# Patient Record
Sex: Female | Born: 1975 | Race: Black or African American | Marital: Married | Smoking: Never smoker
Health system: Southern US, Community
[De-identification: ages and names within clinical notes are randomized; demographics above are authoritative.]

## PROBLEM LIST (undated history)

## (undated) DIAGNOSIS — F32A Depression, unspecified: Secondary | ICD-10-CM

## (undated) DIAGNOSIS — F329 Major depressive disorder, single episode, unspecified: Secondary | ICD-10-CM

## (undated) DIAGNOSIS — K219 Gastro-esophageal reflux disease without esophagitis: Secondary | ICD-10-CM

## (undated) DIAGNOSIS — F419 Anxiety disorder, unspecified: Secondary | ICD-10-CM

## (undated) DIAGNOSIS — T7840XA Allergy, unspecified, initial encounter: Secondary | ICD-10-CM

## (undated) DIAGNOSIS — Z8619 Personal history of other infectious and parasitic diseases: Secondary | ICD-10-CM

## (undated) HISTORY — DX: Allergy, unspecified, initial encounter: T78.40XA

## (undated) HISTORY — DX: Gastro-esophageal reflux disease without esophagitis: K21.9

## (undated) HISTORY — PX: TUBAL LIGATION: SHX77

## (undated) HISTORY — DX: Major depressive disorder, single episode, unspecified: F32.9

## (undated) HISTORY — DX: Anxiety disorder, unspecified: F41.9

## (undated) HISTORY — PX: APPENDECTOMY: SHX54

## (undated) HISTORY — DX: Depression, unspecified: F32.A

## (undated) HISTORY — DX: Personal history of other infectious and parasitic diseases: Z86.19

---
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
## 2001-10-16 ENCOUNTER — Emergency Department (HOSPITAL_COMMUNITY): Admission: EM | Admit: 2001-10-16 | Discharge: 2001-10-16 | Payer: Self-pay | Admitting: Emergency Medicine

## 2002-04-29 ENCOUNTER — Emergency Department (HOSPITAL_COMMUNITY): Admission: EM | Admit: 2002-04-29 | Discharge: 2002-04-29 | Payer: Self-pay | Admitting: *Deleted

## 2002-05-26 ENCOUNTER — Emergency Department (HOSPITAL_COMMUNITY): Admission: EM | Admit: 2002-05-26 | Discharge: 2002-05-26 | Payer: Self-pay

## 2003-08-05 ENCOUNTER — Emergency Department (HOSPITAL_COMMUNITY): Admission: EM | Admit: 2003-08-05 | Discharge: 2003-08-05 | Payer: Self-pay | Admitting: *Deleted

## 2004-06-13 ENCOUNTER — Emergency Department (HOSPITAL_COMMUNITY): Admission: EM | Admit: 2004-06-13 | Discharge: 2004-06-13 | Payer: Self-pay | Admitting: Family Medicine

## 2004-08-30 ENCOUNTER — Other Ambulatory Visit: Admission: RE | Admit: 2004-08-30 | Discharge: 2004-08-30 | Payer: Self-pay | Admitting: Obstetrics and Gynecology

## 2004-09-22 ENCOUNTER — Encounter: Admission: RE | Admit: 2004-09-22 | Discharge: 2004-09-22 | Payer: Self-pay | Admitting: Internal Medicine

## 2004-10-13 ENCOUNTER — Emergency Department (HOSPITAL_COMMUNITY): Admission: EM | Admit: 2004-10-13 | Discharge: 2004-10-13 | Payer: Self-pay | Admitting: Emergency Medicine

## 2005-12-08 ENCOUNTER — Emergency Department (HOSPITAL_COMMUNITY): Admission: EM | Admit: 2005-12-08 | Discharge: 2005-12-08 | Payer: Self-pay | Admitting: Family Medicine

## 2007-10-28 ENCOUNTER — Inpatient Hospital Stay (HOSPITAL_COMMUNITY): Admission: AD | Admit: 2007-10-28 | Discharge: 2007-10-30 | Payer: Self-pay | Admitting: Obstetrics and Gynecology

## 2008-06-12 ENCOUNTER — Emergency Department (HOSPITAL_COMMUNITY): Admission: EM | Admit: 2008-06-12 | Discharge: 2008-06-12 | Payer: Self-pay | Admitting: Family Medicine

## 2009-07-08 ENCOUNTER — Encounter: Admission: RE | Admit: 2009-07-08 | Discharge: 2009-07-08 | Payer: Self-pay | Admitting: Internal Medicine

## 2009-07-16 LAB — CONVERTED CEMR LAB

## 2009-08-30 ENCOUNTER — Ambulatory Visit: Payer: Self-pay | Admitting: Genetic Counselor

## 2009-11-24 ENCOUNTER — Ambulatory Visit: Payer: Self-pay | Admitting: Genetic Counselor

## 2010-05-14 ENCOUNTER — Emergency Department (HOSPITAL_COMMUNITY): Admission: EM | Admit: 2010-05-14 | Discharge: 2010-05-14 | Payer: Self-pay | Admitting: Family Medicine

## 2010-06-15 ENCOUNTER — Ambulatory Visit: Payer: Self-pay | Admitting: Internal Medicine

## 2010-06-15 DIAGNOSIS — Z9189 Other specified personal risk factors, not elsewhere classified: Secondary | ICD-10-CM | POA: Insufficient documentation

## 2010-06-15 DIAGNOSIS — Z87448 Personal history of other diseases of urinary system: Secondary | ICD-10-CM | POA: Insufficient documentation

## 2010-06-15 LAB — CONVERTED CEMR LAB
ALT: 30 units/L (ref 0–35)
AST: 27 units/L (ref 0–37)
Albumin: 4.1 g/dL (ref 3.5–5.2)
Alkaline Phosphatase: 57 units/L (ref 39–117)
BUN: 14 mg/dL (ref 6–23)
Basophils Absolute: 0 10*3/uL (ref 0.0–0.1)
Basophils Relative: 0.3 % (ref 0.0–3.0)
Bilirubin Urine: NEGATIVE
Bilirubin, Direct: 0.1 mg/dL (ref 0.0–0.3)
CO2: 26 meq/L (ref 19–32)
Calcium: 9.1 mg/dL (ref 8.4–10.5)
Chloride: 108 meq/L (ref 96–112)
Cholesterol: 121 mg/dL (ref 0–200)
Creatinine, Ser: 0.9 mg/dL (ref 0.4–1.2)
Eosinophils Absolute: 0.1 10*3/uL (ref 0.0–0.7)
Eosinophils Relative: 1.3 % (ref 0.0–5.0)
GFR calc non Af Amer: 92.87 mL/min (ref >60.00)
Glucose, Bld: 85 mg/dL (ref 70–99)
HCT: 42.4 % (ref 36.0–46.0)
HDL: 42.3 mg/dL (ref >39.00)
Hemoglobin: 14.5 g/dL (ref 12.0–15.0)
Ketones, ur: NEGATIVE mg/dL
LDL Cholesterol: 66 mg/dL (ref 0–99)
Lymphocytes Relative: 32.7 % (ref 12.0–46.0)
Lymphs Abs: 3.1 10*3/uL (ref 0.7–4.0)
MCHC: 34.1 g/dL (ref 30.0–36.0)
MCV: 88.7 fL (ref 78.0–100.0)
Monocytes Absolute: 0.7 10*3/uL (ref 0.1–1.0)
Monocytes Relative: 7.5 % (ref 3.0–12.0)
Neutro Abs: 5.6 10*3/uL (ref 1.4–7.7)
Neutrophils Relative %: 58.2 % (ref 43.0–77.0)
Nitrite: NEGATIVE
Platelets: 290 10*3/uL (ref 150.0–400.0)
Potassium: 4.7 meq/L (ref 3.5–5.1)
RBC: 4.78 M/uL (ref 3.87–5.11)
RDW: 12.8 % (ref 11.5–14.6)
Sodium: 141 meq/L (ref 135–145)
Specific Gravity, Urine: 1.025 (ref 1.000–1.030)
TSH: 0.81 microintl units/mL (ref 0.35–5.50)
Total Bilirubin: 1 mg/dL (ref 0.3–1.2)
Total CHOL/HDL Ratio: 3
Total Protein, Urine: NEGATIVE mg/dL
Total Protein: 7.2 g/dL (ref 6.0–8.3)
Triglycerides: 65 mg/dL (ref 0.0–149.0)
Urine Glucose: NEGATIVE mg/dL
Urobilinogen, UA: 0.2 (ref 0.0–1.0)
VLDL: 13 mg/dL (ref 0.0–40.0)
WBC: 9.6 10*3/uL (ref 4.5–10.5)
pH: 6 (ref 5.0–8.0)

## 2010-07-28 NOTE — Assessment & Plan Note (Signed)
Summary: New/Bcbs/#/cd   Vital Signs:  Patient profile:   35 year old female Height:      64 inches (162.56 cm) Weight:      170.8 pounds (77.64 kg) BMI:     29.42 O2 Sat:      98 % on Room air Temp:     98.0 degrees F (36.67 degrees C) oral Pulse rate:   76 / minute BP sitting:   90 / 68  (left arm) Cuff size:   regular  Vitals Entered By: Orlan Leavens RMA (June 15, 2010 8:19 AM)  O2 Flow:  Room air CC: New patient Is Patient Diabetic? No Pain Assessment Patient in pain? no        Primary Care Denzell Colasanti:  Newt Lukes MD  CC:  New patient.  History of Present Illness: new pt to me and our practice, here to est care  patient is here today for annual physical. Patient feels well and has no complaints.   Preventive Screening-Counseling & Management  Alcohol-Tobacco     Alcohol drinks/day: <1     Alcohol Counseling: not indicated; use of alcohol is not excessive or problematic     Smoking Status: quit     Year Started: 1989     Year Quit: 2001     Tobacco Counseling: to remain off tobacco products  Caffeine-Diet-Exercise     Does Patient Exercise: yes     Exercise Counseling: not indicated; exercise is adequate  Safety-Violence-Falls     Seat Belt Counseling: not indicated; patient wears seat belts     Helmet Counseling: not indicated; patient wears helmet when riding bicycle/motocycle     Firearms in the Home: no firearms in the home     Firearm Counseling: not applicable     Violence in the Home: no risk noted     Sexual Abuse: no     Fall Risk Counseling: not indicated; no significant falls noted  Clinical Review Panels:  Prevention   Last Mammogram:  No specific mammographic evidence of malignancy.   (08/17/2009)   Last Pap Smear:  Interpretation Result:Negative for intraepithelial Lesion or Malignancy.    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 I have reviewed all other systems and they were negative.   Physical Exam  General:  alert, well-developed, well-nourished, and cooperative to examination.    Head:  Normocephalic and atraumatic without obvious abnormalities. No apparent alopecia or balding. Eyes:  vision grossly intact; pupils equal, round and reactive to light.  conjunctiva and lids normal.    Ears:  normal pinnae bilaterally, without erythema, swelling, or tenderness to palpation. TMs clear, without effusion, or cerumen impaction. Hearing grossly normal bilaterally  Mouth:  teeth and gums in good repair; mucous membranes moist, without lesions or ulcers. oropharynx clear without exudate,  no erythema.  Neck:  supple, full ROM, no masses, no thyromegaly; no thyroid nodules or tenderness. no JVD or carotid bruits.   Lungs:  normal respiratory effort, no intercostal retractions or use of accessory muscles; normal breath sounds bilaterally - no crackles and no wheezes.    Heart:  normal rate, regular rhythm, no murmur, and no rub. BLE without edema. normal DP pulses and normal cap refill in all 4 extremities    Abdomen:  soft, non-tender, normal bowel sounds, no distention; no masses and no appreciable hepatomegaly or splenomegaly.   Genitalia:  defer gyn Msk:  No deformity or scoliosis noted of thoracic or lumbar  spine.   Neurologic:  alert & oriented X3 and cranial nerves II-XII symetrically intact.  strength normal in all extremities, sensation intact to light touch, and gait normal. speech fluent without dysarthria or aphasia; follows commands with good comprehension.  Skin:  no rashes, vesicles, ulcers, or erythema. No nodules or irregularity to palpation.  Psych:  Oriented X3, memory intact for recent and remote, normally interactive, good eye contact, not anxious appearing, not depressed appearing, and not agitated.      Impression & Recommendations:  Problem # 1:  PREVENTIVE HEALTH CARE (ICD-V70.0) Patient has been counseled on age-appropriate routine health concerns for screening and prevention. These are reviewed and up-to-date. Immunizations are up-to-date or declined. Labs ordered and will be reviewed.  Orders: TLB-Lipid Panel (80061-LIPID) TLB-BMP (Basic Metabolic Panel-BMET) (80048-METABOL) TLB-CBC Platelet - w/Differential (85025-CBCD) TLB-Hepatic/Liver Function Pnl (80076-HEPATIC) TLB-TSH (Thyroid Stimulating Hormone) (84443-TSH) TLB-Udip w/ Micro (81001-URINE)  Patient Instructions: 1)  it was good to see you today. 2)  test(s) ordered today - your results will be posted on the phone tree for review in 48-72 hours from the time of test completion; call [phone] and enter your 9 digit MRN (listed above on this page, just below your name); if any changes need to be made or there are abnormal results, you will be contacted directly. 3)  stay active and eat healthy as we discussed - this is the best way to control your risk of developing diabetes 4)  Please schedule a follow-up appointment annually for medical physical and labs, call sooner if problems.    Orders Added: 1)  TLB-Lipid Panel [80061-LIPID] 2)  TLB-BMP (Basic Metabolic Panel-BMET) [80048-METABOL] 3)  TLB-CBC Platelet - w/Differential [85025-CBCD] 4)  TLB-Hepatic/Liver Function Pnl [80076-HEPATIC] 5)  TLB-TSH (Thyroid  Stimulating Hormone) [84443-TSH] 6)  TLB-Udip w/ Micro [81001-URINE] 7)  New Patient 18-39 years [99385]   Immunization History:  Tetanus/Td Immunization History:    Tetanus/Td:  historical (06/27/2003)   Immunization History:  Tetanus/Td Immunization History:    Tetanus/Td:  Historical (06/27/2003)    Pap Smear  Procedure date:  07/16/2009  Findings:      Interpretation Result:Negative for intraepithelial Lesion or Malignancy.     Mammogram  Procedure date:  08/17/2009  Findings:      No specific mammographic evidence of malignancy.

## 2010-09-06 LAB — POCT URINALYSIS DIPSTICK
Bilirubin Urine: NEGATIVE
Glucose, UA: NEGATIVE mg/dL
Ketones, ur: NEGATIVE mg/dL
Nitrite: NEGATIVE
Protein, ur: NEGATIVE mg/dL
Specific Gravity, Urine: 1.02 (ref 1.005–1.030)
Urobilinogen, UA: 0.2 mg/dL (ref 0.0–1.0)
pH: 5 (ref 5.0–8.0)

## 2010-09-06 LAB — POCT PREGNANCY, URINE: Preg Test, Ur: NEGATIVE

## 2011-03-30 LAB — WET PREP, GENITAL: Yeast Wet Prep HPF POC: NONE SEEN

## 2011-03-30 LAB — GC/CHLAMYDIA PROBE AMP, GENITAL
Chlamydia, DNA Probe: NEGATIVE
GC Probe Amp, Genital: NEGATIVE

## 2011-03-30 LAB — POCT PREGNANCY, URINE: Preg Test, Ur: NEGATIVE

## 2013-10-18 DIAGNOSIS — F988 Other specified behavioral and emotional disorders with onset usually occurring in childhood and adolescence: Secondary | ICD-10-CM | POA: Insufficient documentation

## 2015-07-14 LAB — CBC AND DIFFERENTIAL
HCT: 43 (ref 36–46)
Hemoglobin: 13.9 (ref 12.0–16.0)
Platelets: 283 (ref 150–399)
WBC: 9.9

## 2016-05-19 ENCOUNTER — Encounter: Payer: Self-pay | Admitting: Family Medicine

## 2017-01-10 ENCOUNTER — Encounter: Payer: Self-pay | Admitting: Family Medicine

## 2017-05-05 ENCOUNTER — Emergency Department (HOSPITAL_COMMUNITY): Payer: Self-pay

## 2017-05-05 ENCOUNTER — Encounter (HOSPITAL_COMMUNITY): Payer: Self-pay | Admitting: Emergency Medicine

## 2017-05-05 ENCOUNTER — Emergency Department (HOSPITAL_COMMUNITY)
Admission: EM | Admit: 2017-05-05 | Discharge: 2017-05-05 | Disposition: A | Discharge status: Home / Self Care | Payer: Self-pay | Attending: Emergency Medicine | Admitting: Emergency Medicine

## 2017-05-05 DIAGNOSIS — R6889 Other general symptoms and signs: Secondary | ICD-10-CM | POA: Insufficient documentation

## 2017-05-05 DIAGNOSIS — R112 Nausea with vomiting, unspecified: Secondary | ICD-10-CM | POA: Insufficient documentation

## 2017-05-05 LAB — CBC WITH DIFFERENTIAL/PLATELET
Basophils Absolute: 0 10*3/uL (ref 0.0–0.1)
Basophils Relative: 0 %
Eosinophils Absolute: 0.1 10*3/uL (ref 0.0–0.7)
Eosinophils Relative: 1 %
HCT: 40.9 % (ref 36.0–46.0)
Hemoglobin: 13.5 g/dL (ref 12.0–15.0)
Lymphocytes Relative: 35 %
Lymphs Abs: 3.4 10*3/uL (ref 0.7–4.0)
MCH: 29.1 pg (ref 26.0–34.0)
MCHC: 33 g/dL (ref 30.0–36.0)
MCV: 88.1 fL (ref 78.0–100.0)
Monocytes Absolute: 0.5 10*3/uL (ref 0.1–1.0)
Monocytes Relative: 6 %
Neutro Abs: 5.5 10*3/uL (ref 1.7–7.7)
Neutrophils Relative %: 58 %
Platelets: 296 10*3/uL (ref 150–400)
RBC: 4.64 MIL/uL (ref 3.87–5.11)
RDW: 12.7 % (ref 11.5–15.5)
WBC: 9.6 10*3/uL (ref 4.0–10.5)

## 2017-05-05 LAB — INFLUENZA PANEL BY PCR (TYPE A & B)
Influenza A By PCR: NEGATIVE
Influenza B By PCR: NEGATIVE

## 2017-05-05 LAB — URINALYSIS, ROUTINE W REFLEX MICROSCOPIC
Bilirubin Urine: NEGATIVE
Glucose, UA: NEGATIVE mg/dL
Hgb urine dipstick: NEGATIVE
Ketones, ur: NEGATIVE mg/dL
Leukocytes, UA: NEGATIVE
Nitrite: NEGATIVE
Protein, ur: NEGATIVE mg/dL
Specific Gravity, Urine: 1.021 (ref 1.005–1.030)
pH: 8 (ref 5.0–8.0)

## 2017-05-05 LAB — COMPREHENSIVE METABOLIC PANEL
ALT: 13 U/L — ABNORMAL LOW (ref 14–54)
AST: 21 U/L (ref 15–41)
Albumin: 3.9 g/dL (ref 3.5–5.0)
Alkaline Phosphatase: 61 U/L (ref 38–126)
Anion gap: 8 (ref 5–15)
BUN: 13 mg/dL (ref 6–20)
CO2: 22 mmol/L (ref 22–32)
Calcium: 8.7 mg/dL — ABNORMAL LOW (ref 8.9–10.3)
Chloride: 104 mmol/L (ref 101–111)
Creatinine, Ser: 0.94 mg/dL (ref 0.44–1.00)
GFR calc Af Amer: >60 mL/min (ref >60)
GFR calc non Af Amer: >60 mL/min (ref >60)
Glucose, Bld: 91 mg/dL (ref 65–99)
Potassium: 4.1 mmol/L (ref 3.5–5.1)
Sodium: 134 mmol/L — ABNORMAL LOW (ref 135–145)
Total Bilirubin: 0.9 mg/dL (ref 0.3–1.2)
Total Protein: 7 g/dL (ref 6.5–8.1)

## 2017-05-05 LAB — I-STAT CG4 LACTIC ACID, ED: Lactic Acid, Venous: 0.72 mmol/L (ref 0.5–1.9)

## 2017-05-05 LAB — I-STAT BETA HCG BLOOD, ED (MC, WL, AP ONLY): I-stat hCG, quantitative: <5 m[IU]/mL (ref <5)

## 2017-05-05 LAB — LIPASE, BLOOD: Lipase: 18 U/L (ref 11–51)

## 2017-05-05 MED ORDER — ONDANSETRON HCL 4 MG/2ML IJ SOLN
4.0000 mg | Freq: Once | INTRAMUSCULAR | Status: AC
  Administered 2017-05-05: 4 mg via INTRAVENOUS
  Filled 2017-05-05: qty 2

## 2017-05-05 MED ORDER — PROCHLORPERAZINE EDISYLATE 5 MG/ML IJ SOLN
5.0000 mg | Freq: Once | INTRAMUSCULAR | Status: AC
  Administered 2017-05-05: 5 mg via INTRAVENOUS
  Filled 2017-05-05: qty 2

## 2017-05-05 MED ORDER — PROMETHAZINE HCL 25 MG PO TABS: 12.5000 mg | ORAL_TABLET | Freq: Four times a day (QID) | ORAL | 0 refills | Status: DC | PRN

## 2017-05-05 MED ORDER — SODIUM CHLORIDE 0.9 % IV BOLUS (SEPSIS)
1000.0000 mL | Freq: Once | INTRAVENOUS | Status: AC
  Administered 2017-05-05: 1000 mL via INTRAVENOUS

## 2017-05-05 MED ORDER — DIPHENHYDRAMINE HCL 50 MG/ML IJ SOLN
12.5000 mg | Freq: Once | INTRAMUSCULAR | Status: AC
  Administered 2017-05-05: 12.5 mg via INTRAVENOUS
  Filled 2017-05-05: qty 1

## 2017-05-05 MED ORDER — ACETAMINOPHEN 500 MG PO TABS
1000.0000 mg | ORAL_TABLET | Freq: Once | ORAL | Status: AC
  Administered 2017-05-05: 1000 mg via ORAL
  Filled 2017-05-05: qty 2

## 2017-05-05 NOTE — ED Notes (Signed)
Patient transported to X-ray 

## 2017-05-05 NOTE — Discharge Instructions (Signed)
You appear to have a viral infection.  Your flu swab is currently still pending and you may check this through my chart. Please return to the emergency department if you are unable to hold down foods or fluids or have any new or concerning symptoms, worsening or localizing abdominal pain.

## 2017-05-05 NOTE — ED Notes (Signed)
Pt verbalized understanding of discharge instructions and denies any further questions at this time.   

## 2017-05-05 NOTE — ED Triage Notes (Signed)
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

## 2017-05-05 NOTE — ED Provider Notes (Signed)
[facility]   CSN: [id number] Arrival date & time: 05/05/17  0815     History   Chief Complaint Chief Complaint  Patient presents with  . Emesis  . Nausea  . Generalized Body Aches    HPI Paige Wilkinson is a 41 y.o. female.  Who presents the emergency department chief complaint of nausea and vomiting.  States that yesterday she had prodromal symptoms of fatigue, myalgias, body aches.  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  After work she stopped and got a coffee drink and then began having vomiting.  She states that she has had multiple episodes of nonbloody nonbilious vomitus and has been unable to hold down any fluids since last night.  She complains of mild headache, nausea and body aches.  She denies any focal abdominal tenderness.  She denies flank pain or urinary symptoms.  She denies any ingestion of suspicious foods, recent foreign travel or sick contacts with similar symptoms.  She does have a painful cough that has developed states that it feels that it is in her bronchi.  She denies any chest pain, shortness of breath.  The patient did not take any medications prior to arrival.  She has no history of similar or recurrent symptoms.  HPI  History reviewed. No pertinent past medical history.  Patient Active Problem List   Diagnosis Date Noted  . UTI'S, HX OF 06/15/2010  . CHICKENPOX, HX OF 06/15/2010    Past Surgical History:  Procedure Laterality Date  . APPENDECTOMY    . TUBAL LIGATION      OB History    No data available       Home Medications    Prior to Admission medications   Medication Sig Start Date End Date Taking? Authorizing Provider  promethazine (PHENERGAN) 25 MG tablet Take 0.5-1 tablets (12.5-25 mg total) every 6 (six) hours as needed by mouth for nausea or vomiting. 05/05/17   Arthor CaptainHarris, Ayan Yankey, PA-C    Family History No family history on  file.  Social History Social History   Tobacco Use  . Smoking status: Never Smoker  . Smokeless tobacco: Never Used  Substance Use Topics  . Alcohol use: No    Frequency: Never  . Drug use: No     Allergies   Patient has no allergy information on record.   Review of Systems Review of Systems  Ten systems reviewed and are negative for acute change, except as noted in the HPI.   Physical Exam Updated Vital Signs BP 119/76 (BP Location: Right Arm)   Pulse 82   Temp 98.3 F (36.8 C) (Oral)   Resp 20   LMP 04/26/2017   SpO2 96%   Physical Exam  Constitutional: She is oriented to person, place, and time. She appears well-developed and well-nourished. No distress.  HENT:  Head: Normocephalic and atraumatic.  Eyes: Conjunctivae and EOM are normal. Pupils are equal, round, and reactive to light. No scleral icterus.  Glassy eyed, Nasal erythema  Neck: Normal range of motion.  Cardiovascular: Normal rate, regular rhythm, normal heart sounds and intact distal pulses. Exam reveals no gallop and no friction rub.  No murmur heard. Pulmonary/Chest: Effort normal and breath sounds normal. No respiratory distress.  Abdominal: Soft. Bowel sounds are normal. She exhibits no distension and no mass. There is no tenderness. There is no guarding.  Neurological: She is alert and oriented to person, place, and time.  Skin: Skin is warm and dry. She is not diaphoretic.  Psychiatric: Her behavior is normal.  Nursing note and vitals reviewed.    ED Treatments / Results  Labs (all labs ordered are listed, but only abnormal results are displayed) Labs Reviewed  COMPREHENSIVE METABOLIC PANEL - Abnormal; Notable for the following components:      Result Value   Sodium 134 (*)    Calcium 8.7 (*)    ALT 13 (*)    All other components within normal limits  CBC WITH DIFFERENTIAL/PLATELET  URINALYSIS, ROUTINE W REFLEX MICROSCOPIC  LIPASE, BLOOD  INFLUENZA PANEL BY PCR (TYPE A & B)   I-STAT CG4 LACTIC ACID, ED  I-STAT BETA HCG BLOOD, ED ([facility], [facility], [facility] ONLY)  I-STAT BETA HCG BLOOD, ED ([facility], [facility], [facility] ONLY)    EKG  EKG Interpretation None       Radiology Dg Chest 2 View  Result Date: 05/05/2017 CLINICAL DATA:  Chills with nausea and vomiting. EXAM: CHEST  2 VIEW COMPARISON:  None. FINDINGS: Lungs are clear. Heart size and pulmonary vascularity are normal. No adenopathy. No pneumothorax. No bone lesions. IMPRESSION: No edema or consolidation. Electronically Signed   By: Bretta BangWilliam  Woodruff III M.D.   On: 05/05/2017 08:52    Procedures Procedures (including critical care time)  Medications Ordered in ED Medications  ondansetron (ZOFRAN) injection 4 mg (4 mg Intravenous Given 05/05/17 0908)  sodium chloride 0.9 % bolus 1,000 mL (0 mLs Intravenous Stopped 05/05/17 1034)  acetaminophen (TYLENOL) tablet 1,000 mg (1,000 mg Oral Given 05/05/17 1016)  prochlorperazine (COMPAZINE) injection 5 mg (5 mg Intravenous Given 05/05/17 1041)  diphenhydrAMINE (BENADRYL) injection 12.5 mg (12.5 mg Intravenous Given 05/05/17 1041)     Initial Impression / Assessment and Plan / ED Course  I have reviewed the triage vital signs and the nursing notes.  Pertinent labs & imaging results that were available during my care of the patient were reviewed by me and considered in my medical decision making (see chart for details).  Clinical Course as of May 05 1099  Sat May 05, 2017  1047 Patient n/v/ and flu like sxs.. Most likely viral.  Flu is pending. She feels improved. Vitals are stable, no fever.  No signs of dehydration, tolerating PO fluids > 6 oz.  Lungs are clear.  No focal abdominal pain, no concern for appendicitis, cholecystitis, pancreatitis, ruptured viscus, UTI, kidney stone, or any other abdominal etiology.  Supportive therapy indicated with return if symptoms worsen.  Patient counseled on return precautions.   [AH]    Clinical Course User Index [AH] Arthor CaptainHarris, Harini Dearmond, PA-C       Final Clinical Impressions(s) / ED Diagnoses   Final diagnoses:  Flu-like symptoms  Nausea and vomiting, intractability of vomiting not specified, unspecified vomiting type    ED Discharge Orders        Ordered    promethazine (PHENERGAN) 25 MG tablet  Every 6 hours PRN     05/05/17 1053       Arthor CaptainHarris, Naleigha Raimondi, PA-C 05/05/17 1100    Alvira MondaySchlossman, Erin, MD 05/06/17 2147

## 2017-05-05 NOTE — ED Notes (Signed)
ED Provider at bedside. 

## 2017-06-20 DIAGNOSIS — L089 Local infection of the skin and subcutaneous tissue, unspecified: Secondary | ICD-10-CM | POA: Diagnosis not present

## 2017-06-20 DIAGNOSIS — M791 Myalgia, unspecified site: Secondary | ICD-10-CM | POA: Diagnosis not present

## 2017-06-20 DIAGNOSIS — S90561A Insect bite (nonvenomous), right ankle, initial encounter: Secondary | ICD-10-CM | POA: Diagnosis not present

## 2017-06-20 DIAGNOSIS — S30860A Insect bite (nonvenomous) of lower back and pelvis, initial encounter: Secondary | ICD-10-CM | POA: Diagnosis not present

## 2017-07-13 ENCOUNTER — Encounter: Payer: Self-pay | Admitting: Family Medicine

## 2017-07-13 ENCOUNTER — Ambulatory Visit: Payer: BLUE CROSS/BLUE SHIELD | Admitting: Family Medicine

## 2017-07-13 VITALS — BP 94/78 | HR 74 | Temp 98.1°F | Ht 64.5 in | Wt 187.3 lb

## 2017-07-13 DIAGNOSIS — Z7689 Persons encountering health services in other specified circumstances: Secondary | ICD-10-CM | POA: Diagnosis not present

## 2017-07-13 DIAGNOSIS — F988 Other specified behavioral and emotional disorders with onset usually occurring in childhood and adolescence: Secondary | ICD-10-CM | POA: Diagnosis not present

## 2017-07-13 NOTE — Progress Notes (Signed)
Patient presents to clinic today to establish care.  SUBJECTIVE: PMH:Pt is a 42 yo with pmh sig for ADD, and depression.  She was formerly seen in [place], [place]. Last Physical was 01/08/2017.  Pt is a G6P3.  LMP 06/26/17  ADD: -Pt states she underwent testing in [place]. -Pt endorses difficulty focusing x yrs -she noticed the difficulty focusing more in 2012 when she was in school.   -Pt has been taking adderall ER 30 mg.  She has been getting the rx from Dr. Sharlett Iles, a provider she works with at [facility].  H/o Depression: -pt states she had an episode of depression yrs ago, but does not "claim that now". -Pt states she went through a rough yr.  She was homeless at one point. -Pt states she then moved to [facility], and has been doing well ever since. -pt was never on meds.  Allergies: NKDA  P Surgical Hx: Bunionectomy Tubal ligation-essur 2013 Appendectomy 07/18/1999  Social history: Patient is married.  She has 3 children.  Patient has a daughter age 22 and 2 sons ages 40 and 25.  Patient is also a grandmother, with a 42-year-old and one on the way.  Pt works as a Technical brewer at [employer].  Patient denies tobacco and drug use.  She formerly smoked cigarettes in her late teens.  Patient endorses rare ethanol use stating she may have a glass of wine every now and then.  Family medical history: Mom-deceased, breast cancer at age 8, HIV via blood transfusion.  Died at age 87 Dad-deceased cancer unsure what type Brother-Richard, alive MGM-alive, arthritis, DM, hearing loss PGM-deceased, asthma, DM, HLD, HTN, kidney disease, obesity  Health Maintenance: Immunizations -- Tetanus 07/2012, influenza 03/26/17, TB skin test 07/2012 Mammogram --01/09/2017 PAP -- 01/08/17    Past Medical History:  Diagnosis Date  . Depression   . History of chicken pox     Past Surgical History:  Procedure Laterality Date  . APPENDECTOMY    . TUBAL LIGATION      Current Outpatient Medications on  File Prior to Visit  Medication Sig Dispense Refill  . amphetamine-dextroamphetamine (ADDERALL) 20 MG tablet Take 20 mg by mouth daily.    . Ascorbic Acid (VITAMIN C) 1000 MG tablet Take 1,000 mg by mouth daily.    Marland Kitchen BIOTIN PO Take by mouth.    . cyanocobalamin 500 MCG tablet Take 500 mcg by mouth daily.     No current facility-administered medications on file prior to visit.     No Known Allergies  History reviewed. No pertinent family history.  Social History   Socioeconomic History  . Marital status: Married    Spouse name: Not on file  . Number of children: Not on file  . Years of education: Not on file  . Highest education level: Not on file  Social Needs  . Financial resource strain: Not on file  . Food insecurity - worry: Not on file  . Food insecurity - inability: Not on file  . Transportation needs - medical: Not on file  . Transportation needs - non-medical: Not on file  Occupational History  . Not on file  Tobacco Use  . Smoking status: Former Research scientist (life sciences)  . Smokeless tobacco: Never Used  Substance and Sexual Activity  . Alcohol use: Yes    Frequency: Never    Comment: rarely  . Drug use: No  . Sexual activity: Not on file  Other Topics Concern  . Not on file  Social  History Narrative  . Not on file    ROS General: Denies fever, chills, night sweats, changes in weight, changes in appetite HEENT: Denies headaches, ear pain, changes in vision, rhinorrhea, sore throat CV: Denies CP, palpitations, SOB, orthopnea Pulm: Denies SOB, cough, wheezing GI: Denies abdominal pain, nausea, vomiting, diarrhea, constipation GU: Denies dysuria, hematuria, frequency, vaginal discharge Msk: Denies muscle cramps, joint pains Neuro: Denies weakness, numbness, tingling Skin: Denies rashes, bruising Psych: Denies depression, anxiety, hallucinations  BP 94/78 (BP Location: Right Arm, Patient Position: Sitting, Cuff Size: Large)   Pulse 74   Temp 98.1 F (36.7 C) (Oral)   Ht  5' 4.5" (1.638 m)   Wt 187 lb 4.8 oz (85 kg)   BMI 31.65 kg/m   Physical Exam Gen. Pleasant, well developed, well-nourished, in NAD HEENT - [place]/AT, PERRL, no scleral icterus, no nasal drainage, pharynx without erythema or exudate. Lungs: no use of accessory muscles, CTAB, no wheezes, rales or rhonchi Cardiovascular: RRR, No r/g/m, no peripheral edema Abdomen: BS present, soft, nontender,nondistended Neuro:  A&Ox3, CN II-XII intact, normal gait Skin:  Warm, dry, intact, no lesions Psych: normal affect, mood appropriate   Recent Results (from the past 2160 hour(s))  Comprehensive metabolic panel     Status: Abnormal   Collection Time: 05/05/17  8:28 AM  Result Value Ref Range   Sodium 134 (L) 135 - 145 mmol/L   Potassium 4.1 3.5 - 5.1 mmol/L   Chloride 104 101 - 111 mmol/L   CO2 22 22 - 32 mmol/L   Glucose, Bld 91 65 - 99 mg/dL   BUN 13 6 - 20 mg/dL   Creatinine, Ser 0.94 0.44 - 1.00 mg/dL   Calcium 8.7 (L) 8.9 - 10.3 mg/dL   Total Protein 7.0 6.5 - 8.1 g/dL   Albumin 3.9 3.5 - 5.0 g/dL   AST 21 15 - 41 U/L   ALT 13 (L) 14 - 54 U/L   Alkaline Phosphatase 61 38 - 126 U/L   Total Bilirubin 0.9 0.3 - 1.2 mg/dL   GFR calc non Af Amer >60 >60 mL/min   GFR calc Af Amer >60 >60 mL/min    Comment: (NOTE) The eGFR has been calculated using the CKD EPI equation. This calculation has not been validated in all clinical situations. eGFR's persistently <60 mL/min signify possible Chronic Kidney Disease.    Anion gap 8 5 - 15  CBC with Differential     Status: None   Collection Time: 05/05/17  8:28 AM  Result Value Ref Range   WBC 9.6 4.0 - 10.5 K/uL   RBC 4.64 3.87 - 5.11 MIL/uL   Hemoglobin 13.5 12.0 - 15.0 g/dL   HCT 40.9 36.0 - 46.0 %   MCV 88.1 78.0 - 100.0 fL   MCH 29.1 26.0 - 34.0 pg   MCHC 33.0 30.0 - 36.0 g/dL   RDW 12.7 11.5 - 15.5 %   Platelets 296 150 - 400 K/uL   Neutrophils Relative % 58 %   Neutro Abs 5.5 1.7 - 7.7 K/uL   Lymphocytes Relative 35 %   Lymphs Abs  3.4 0.7 - 4.0 K/uL   Monocytes Relative 6 %   Monocytes Absolute 0.5 0.1 - 1.0 K/uL   Eosinophils Relative 1 %   Eosinophils Absolute 0.1 0.0 - 0.7 K/uL   Basophils Relative 0 %   Basophils Absolute 0.0 0.0 - 0.1 K/uL  Lipase, blood     Status: None   Collection Time: 05/05/17  8:28 AM  Result Value Ref Range   Lipase 18 11 - 51 U/L  Urinalysis, Routine w reflex microscopic     Status: None   Collection Time: 05/05/17  8:35 AM  Result Value Ref Range   Color, Urine YELLOW YELLOW   APPearance CLEAR CLEAR   Specific Gravity, Urine 1.021 1.005 - 1.030   pH 8.0 5.0 - 8.0   Glucose, UA NEGATIVE NEGATIVE mg/dL   Hgb urine dipstick NEGATIVE NEGATIVE   Bilirubin Urine NEGATIVE NEGATIVE   Ketones, ur NEGATIVE NEGATIVE mg/dL   Protein, ur NEGATIVE NEGATIVE mg/dL   Nitrite NEGATIVE NEGATIVE   Leukocytes, UA NEGATIVE NEGATIVE  I-Stat beta hCG blood, ED     Status: None   Collection Time: 05/05/17  8:41 AM  Result Value Ref Range   I-stat hCG, quantitative <5.0 <5 mIU/mL   Comment 3            Comment:   GEST. AGE      CONC.  (mIU/mL)   <=1 WEEK        5 - 50     2 WEEKS       50 - 500     3 WEEKS       100 - 10,000     4 WEEKS     1,000 - 30,000        FEMALE AND NON-PREGNANT FEMALE:     LESS THAN 5 mIU/mL   I-Stat CG4 Lactic Acid, ED     Status: None   Collection Time: 05/05/17  8:43 AM  Result Value Ref Range   Lactic Acid, Venous 0.72 0.5 - 1.9 mmol/L  Influenza panel by PCR (type A & B)     Status: None   Collection Time: 05/05/17  8:54 AM  Result Value Ref Range   Influenza A By PCR NEGATIVE NEGATIVE   Influenza B By PCR NEGATIVE NEGATIVE    Comment: (NOTE) The Xpert Xpress Flu assay is intended as an aid in the diagnosis of  influenza and should not be used as a sole basis for treatment.  This  assay is FDA approved for nasopharyngeal swab specimens only. Nasal  washings and aspirates are unacceptable for Xpert Xpress Flu testing.     Assessment/Plan: Attention  deficit disorder, unspecified hyperactivity presence -inattentive type -Continue Adderall ER 30 mg -Database reviewed. -Pt to call clinic when needs refill in the next few wks..  Encounter to establish care -We reviewed the PMH, PSH, FH, SH, Meds and Allergies. -We provided refills for any medications we will prescribe as needed. -We addressed current concerns per orders and patient instructions. -We have asked for records for pertinent exams, studies, vaccines and notes from previous providers. -We have advised patient to follow up per instructions below.    F/u prn.  Discussed scheduling a CPE in the next 4-6 months as pt had labs done in July.   Grier Mitts, MD

## 2017-07-24 ENCOUNTER — Encounter: Payer: Self-pay | Admitting: Family Medicine

## 2017-07-26 ENCOUNTER — Telehealth: Payer: Self-pay | Admitting: Family Medicine

## 2017-07-26 NOTE — Telephone Encounter (Signed)
Please advise on patient Adderall refill. Thank you.

## 2017-07-26 NOTE — Telephone Encounter (Signed)
Pt requesting refill for ADDERALL. No provider noted for this prescription and it is a controlled med.  LOV 07/13/17 Pharmacy  [facility] [address]

## 2017-07-26 NOTE — Telephone Encounter (Unsigned)
Copied from CRM [phone]#[id number]. Topic: Quick Communication - See Telephone Encounter >> Jul 25, 2017  3:58 PM Waymon AmatoBurton, Donna F wrote: CRM for notification. See Telephone encounter for: pt is need to get a refill on adderall [facility] [address] [phone]  07/25/17.

## 2017-07-27 ENCOUNTER — Other Ambulatory Visit: Payer: Self-pay | Admitting: Family Medicine

## 2017-07-31 ENCOUNTER — Other Ambulatory Visit: Payer: Self-pay | Admitting: Family Medicine

## 2017-07-31 MED ORDER — AMPHETAMINE-DEXTROAMPHET ER 30 MG PO CP24: 30.0000 mg | ORAL_CAPSULE | ORAL | 0 refills | Status: DC

## 2017-07-31 NOTE — Progress Notes (Signed)
rx for adderall er 30 mg, 3 month prescriptions printed out.

## 2017-07-31 NOTE — Telephone Encounter (Signed)
rx's printed out

## 2017-08-01 NOTE — Telephone Encounter (Signed)
Left a VM for patient making her aware that prescriptions are ready for pickup and will be waiting at the front desk.

## 2017-08-06 ENCOUNTER — Encounter: Payer: Self-pay | Admitting: Family Medicine

## 2017-08-06 ENCOUNTER — Ambulatory Visit (INDEPENDENT_AMBULATORY_CARE_PROVIDER_SITE_OTHER): Payer: BLUE CROSS/BLUE SHIELD | Admitting: Family Medicine

## 2017-08-06 VITALS — BP 100/62 | HR 85 | Temp 98.8°F | Wt 188.3 lb

## 2017-08-06 DIAGNOSIS — B9789 Other viral agents as the cause of diseases classified elsewhere: Secondary | ICD-10-CM | POA: Diagnosis not present

## 2017-08-06 DIAGNOSIS — J069 Acute upper respiratory infection, unspecified: Secondary | ICD-10-CM

## 2017-08-06 DIAGNOSIS — R6889 Other general symptoms and signs: Secondary | ICD-10-CM

## 2017-08-06 LAB — POCT INFLUENZA A/B
Influenza A, POC: NEGATIVE
Influenza B, POC: NEGATIVE

## 2017-08-06 NOTE — Progress Notes (Signed)
Subjective:    Patient ID: Paige KlippelSharleen Furia, female    DOB: June 26, 1976, 42 y.o.   MRN: [id number]  No chief complaint on file.   HPI Patient was seen today for acute concern.  Body aches, fatigue, cough mostly at night, slight fever T-max 101, clogged ears since Friday.  Patient endorses sick contacts including her husband who has the flu.  Patient has tried NyQuil, DayQuil, Tylenol, ibuprofen, Tylenol sinus.  Patient states she is tired of taking medications.  Patient states she tested herself for the flu at work last week ([employer]) and it was negative.  Past Medical History:  Diagnosis Date  . Depression   . History of chicken pox     No Known Allergies  ROS General: Denies night sweats, changes in weight, changes in appetite  +fever, fatigue, body aches HEENT: Denies headaches, ear pain, changes in vision, rhinorrhea, sore throat   +clogged ears CV: Denies CP, palpitations, SOB, orthopnea Pulm: Denies SOB, wheezing  +cough GI: Denies abdominal pain, nausea, vomiting, diarrhea, constipation GU: Denies dysuria, hematuria, frequency, vaginal discharge Msk: Denies muscle cramps, joint pains Neuro: Denies weakness, numbness, tingling Skin: Denies rashes, bruising Psych: Denies depression, anxiety, hallucinations     Objective:    Blood pressure 100/62, pulse 85, temperature 98.8 F (37.1 C), temperature source Oral, weight 188 lb 4.8 oz (85.4 kg), SpO2 98 %.   Gen. Pleasant, well-nourished, in no distress, normal affect   HEENT: [place]/AT, face symmetric, no scleral icterus, PERRLA, nares patent with drainage, pharynx with mild erythema, no exudate.  TMs full b/l. Lungs: no accessory muscle use, CTAB, no wheezes or rales Cardiovascular: RRR, no m/r/g, no peripheral edema Abdomen: BS present, soft, NT/ND Neuro:  A&Ox3, CN II-XII intact, normal gait   Wt Readings from Last 3 Encounters:  08/06/17 188 lb 4.8 oz (85.4 kg)  07/13/17 187 lb 4.8 oz (85 kg)    Lab  Results  Component Value Date   WBC 9.6 05/05/2017   HGB 13.5 05/05/2017   HCT 40.9 05/05/2017   PLT 296 05/05/2017   GLUCOSE 91 05/05/2017   CHOL 121 06/15/2010   TRIG 65.0 06/15/2010   HDL 42.30 06/15/2010   LDLCALC 66 06/15/2010   ALT 13 (L) 05/05/2017   AST 21 05/05/2017   NA 134 (L) 05/05/2017   K 4.1 05/05/2017   CL 104 05/05/2017   CREATININE 0.94 05/05/2017   BUN 13 05/05/2017   CO2 22 05/05/2017   TSH 0.81 06/15/2010    Assessment/Plan:  Viral URI with cough -supportive care -given note for work -pt willing to try flonase for congestion.  States has at home. -pt declined cough syrup.  Flu-like symptoms  - Plan: POCT Influenza A/B negative  F/u prn  Abbe AmsterdamShannon Victoriano Campion, MD

## 2017-08-06 NOTE — Telephone Encounter (Signed)
Patient picked up rx today at the front desk.

## 2017-08-06 NOTE — Patient Instructions (Addendum)

## 2017-10-08 ENCOUNTER — Encounter: Payer: Self-pay | Admitting: Family Medicine

## 2017-10-08 ENCOUNTER — Ambulatory Visit: Payer: BLUE CROSS/BLUE SHIELD | Admitting: Family Medicine

## 2017-10-08 VITALS — BP 112/62 | HR 91 | Temp 97.6°F | Wt 180.0 lb

## 2017-10-08 DIAGNOSIS — Z131 Encounter for screening for diabetes mellitus: Secondary | ICD-10-CM | POA: Diagnosis not present

## 2017-10-08 DIAGNOSIS — Z0000 Encounter for general adult medical examination without abnormal findings: Secondary | ICD-10-CM

## 2017-10-08 DIAGNOSIS — Z13220 Encounter for screening for lipoid disorders: Secondary | ICD-10-CM | POA: Diagnosis not present

## 2017-10-08 DIAGNOSIS — F900 Attention-deficit hyperactivity disorder, predominantly inattentive type: Secondary | ICD-10-CM

## 2017-10-08 LAB — LIPID PANEL
Cholesterol: 125 mg/dL (ref 0–200)
HDL: 56.3 mg/dL (ref >39.00)
LDL Cholesterol: 54 mg/dL (ref 0–99)
NonHDL: 68.27
Total CHOL/HDL Ratio: 2
Triglycerides: 72 mg/dL (ref 0.0–149.0)
VLDL: 14.4 mg/dL (ref 0.0–40.0)

## 2017-10-08 LAB — BASIC METABOLIC PANEL
BUN: 18 mg/dL (ref 6–23)
CO2: 26 mEq/L (ref 19–32)
Calcium: 8.6 mg/dL (ref 8.4–10.5)
Chloride: 104 mEq/L (ref 96–112)
Creatinine, Ser: 0.8 mg/dL (ref 0.40–1.20)
GFR: 101.05 mL/min (ref >60.00)
Glucose, Bld: 81 mg/dL (ref 70–99)
Potassium: 4.6 mEq/L (ref 3.5–5.1)
Sodium: 136 mEq/L (ref 135–145)

## 2017-10-08 LAB — CBC WITH DIFFERENTIAL/PLATELET
Basophils Absolute: 0 10*3/uL (ref 0.0–0.1)
Basophils Relative: 0.4 % (ref 0.0–3.0)
Eosinophils Absolute: 0.2 10*3/uL (ref 0.0–0.7)
Eosinophils Relative: 2.2 % (ref 0.0–5.0)
HCT: 40.8 % (ref 36.0–46.0)
Hemoglobin: 13.7 g/dL (ref 12.0–15.0)
Lymphocytes Relative: 36.5 % (ref 12.0–46.0)
Lymphs Abs: 2.6 10*3/uL (ref 0.7–4.0)
MCHC: 33.7 g/dL (ref 30.0–36.0)
MCV: 88.5 fl (ref 78.0–100.0)
Monocytes Absolute: 0.7 10*3/uL (ref 0.1–1.0)
Monocytes Relative: 9.3 % (ref 3.0–12.0)
Neutro Abs: 3.7 10*3/uL (ref 1.4–7.7)
Neutrophils Relative %: 51.6 % (ref 43.0–77.0)
Platelets: 280 10*3/uL (ref 150.0–400.0)
RBC: 4.61 Mil/uL (ref 3.87–5.11)
RDW: 12.3 % (ref 11.5–15.5)
WBC: 7.2 10*3/uL (ref 4.0–10.5)

## 2017-10-08 LAB — HEMOGLOBIN A1C: Hgb A1c MFr Bld: 5.4 % (ref 4.6–6.5)

## 2017-10-08 MED ORDER — AMPHETAMINE-DEXTROAMPHET ER 30 MG PO CP24: 30.0000 mg | ORAL_CAPSULE | ORAL | 0 refills | Status: DC

## 2017-10-08 NOTE — Patient Instructions (Signed)
Preventive Care 40-64 Years, Female Preventive care refers to lifestyle choices and visits with your health care provider that can promote health and wellness. What does preventive care include?  A yearly physical exam. This is also called an annual well check.  Dental exams once or twice a year.  Routine eye exams. Ask your health care provider how often you should have your eyes checked.  Personal lifestyle choices, including: ? Daily care of your teeth and gums. ? Regular physical activity. ? Eating a healthy diet. ? Avoiding tobacco and drug use. ? Limiting alcohol use. ? Practicing safe sex. ? Taking low-dose aspirin daily starting at age 58. ? Taking vitamin and mineral supplements as recommended by your health care provider. What happens during an annual well check? The services and screenings done by your health care provider during your annual well check will depend on your age, overall health, lifestyle risk factors, and family history of disease. Counseling Your health care provider may ask you questions about your:  Alcohol use.  Tobacco use.  Drug use.  Emotional well-being.  Home and relationship well-being.  Sexual activity.  Eating habits.  Work and work Statistician.  Method of birth control.  Menstrual cycle.  Pregnancy history.  Screening You may have the following tests or measurements:  Height, weight, and BMI.  Blood pressure.  Lipid and cholesterol levels. These may be checked every 5 years, or more frequently if you are over 81 years old.  Skin check.  Lung cancer screening. You may have this screening every year starting at age 78 if you have a 30-pack-year history of smoking and currently smoke or have quit within the past 15 years.  Fecal occult blood test (FOBT) of the stool. You may have this test every year starting at age 65.  Flexible sigmoidoscopy or colonoscopy. You may have a sigmoidoscopy every 5 years or a colonoscopy  every 10 years starting at age 30.  Hepatitis C blood test.  Hepatitis B blood test.  Sexually transmitted disease (STD) testing.  Diabetes screening. This is done by checking your blood sugar (glucose) after you have not eaten for a while (fasting). You may have this done every 1-3 years.  Mammogram. This may be done every 1-2 years. Talk to your health care provider about when you should start having regular mammograms. This may depend on whether you have a family history of breast cancer.  BRCA-related cancer screening. This may be done if you have a family history of breast, ovarian, tubal, or peritoneal cancers.  Pelvic exam and Pap test. This may be done every 3 years starting at age 80. Starting at age 36, this may be done every 5 years if you have a Pap test in combination with an HPV test.  Bone density scan. This is done to screen for osteoporosis. You may have this scan if you are at high risk for osteoporosis.  Discuss your test results, treatment options, and if necessary, the need for more tests with your health care provider. Vaccines Your health care provider may recommend certain vaccines, such as:  Influenza vaccine. This is recommended every year.  Tetanus, diphtheria, and acellular pertussis (Tdap, Td) vaccine. You may need a Td booster every 10 years.  Varicella vaccine. You may need this if you have not been vaccinated.  Zoster vaccine. You may need this after age 5.  Measles, mumps, and rubella (MMR) vaccine. You may need at least one dose of MMR if you were born in  1957 or later. You may also need a second dose.  Pneumococcal 13-valent conjugate (PCV13) vaccine. You may need this if you have certain conditions and were not previously vaccinated.  Pneumococcal polysaccharide (PPSV23) vaccine. You may need one or two doses if you smoke cigarettes or if you have certain conditions.  Meningococcal vaccine. You may need this if you have certain  conditions.  Hepatitis A vaccine. You may need this if you have certain conditions or if you travel or work in places where you may be exposed to hepatitis A.  Hepatitis B vaccine. You may need this if you have certain conditions or if you travel or work in places where you may be exposed to hepatitis B.  Haemophilus influenzae type b (Hib) vaccine. You may need this if you have certain conditions.  Talk to your health care provider about which screenings and vaccines you need and how often you need them. This information is not intended to replace advice given to you by your health care provider. Make sure you discuss any questions you have with your health care provider. Document Released: 07/09/2015 Document Revised: 03/01/2016 Document Reviewed: 04/13/2015 Elsevier Interactive Patient Education  2018 Elsevier Inc.  

## 2017-10-08 NOTE — Progress Notes (Signed)
Subjective:     Paige Wilkinson is a 42 y.o. female and is here for a comprehensive physical exam. The patient reports no problems.  Pt states her concentration is good.  Pt is currently taking Adderall extended release 30 mg daily. Pt denies issues with sleep.  Pt endorses good appetite.  Pt to schedule mammogram.  Pt has a family history of breast cancer.  States her mother was diagnosed at age 62 in her 80s.  Pt inquires about BRCA testing.  Social History   Socioeconomic History  . Marital status: Married    Spouse name: Not on file  . Number of children: Not on file  . Years of education: Not on file  . Highest education level: Not on file  Occupational History  . Not on file  Social Needs  . Financial resource strain: Not on file  . Food insecurity:    Worry: Not on file    Inability: Not on file  . Transportation needs:    Medical: Not on file    Non-medical: Not on file  Tobacco Use  . Smoking status: Former Research scientist (life sciences)  . Smokeless tobacco: Never Used  Substance and Sexual Activity  . Alcohol use: Yes    Frequency: Never    Comment: rarely  . Drug use: No  . Sexual activity: Not on file  Lifestyle  . Physical activity:    Days per week: Not on file    Minutes per session: Not on file  . Stress: Not on file  Relationships  . Social connections:    Talks on phone: Not on file    Gets together: Not on file    Attends religious service: Not on file    Active member of club or organization: Not on file    Attends meetings of clubs or organizations: Not on file    Relationship status: Not on file  . Intimate partner violence:    Fear of current or ex partner: Not on file    Emotionally abused: Not on file    Physically abused: Not on file    Forced sexual activity: Not on file  Other Topics Concern  . Not on file  Social History Narrative  . Not on file   Health Maintenance  Topic Date Due  . HIV Screening  07/17/1990  . PAP SMEAR  07/16/2012  . INFLUENZA  VACCINE  01/24/2018  . TETANUS/TDAP  11/06/2022    The following portions of the patient's history were reviewed and updated as appropriate: allergies, current medications, past family history, past medical history, past social history, past surgical history and problem list.  Review of Systems A comprehensive review of systems was negative.   Objective:    BP 112/62 (BP Location: Right Arm, Patient Position: Sitting, Cuff Size: Normal)   Pulse 91   Temp 97.6 F (36.4 C) (Oral)   Wt 180 lb (81.6 kg)   LMP 09/27/2017 (Exact Date)   SpO2 98%   BMI 30.42 kg/m  General appearance: alert, cooperative and no distress Head: Normocephalic, without obvious abnormality, atraumatic Eyes: conjunctivae/corneas clear. PERRL, EOM's intact. Fundi benign. Ears: normal TM's and external ear canals both ears Nose: Nares normal. Septum midline. Mucosa normal. No drainage or sinus tenderness. Throat: lips, mucosa, and tongue normal; teeth and gums normal Back: symmetric, no curvature. ROM normal. No CVA tenderness. Lungs: clear to auscultation bilaterally Heart: regular rate and rhythm, S1, S2 normal, no murmur, click, rub or gallop Abdomen: soft, non-tender; bowel sounds  normal; no masses,  no organomegaly Pelvic: deferred Extremities: extremities normal, atraumatic, no cyanosis or edema Skin: Skin color, texture, turgor normal. No rashes or lesions Neurologic: Alert and oriented X 3, normal strength and tone. Normal symmetric reflexes. Normal coordination and gait    Assessment:    Healthy female exam.  H/o ADHD on adderall daily.     Plan:      Anticipatory guidance given including wearing seatbelts, increasing physical activity, smoke detectors in the home, increasing p.o. intake of water p.o. intake of vegetables. -We will obtain labs this visit -immunizations up to date. -Pap deferred to OB/GYN patient to establish with them in the coming. -Patient given handout to schedule mammogram.    See After Visit Summary for Counseling Recommendations    Screen for DM -Hgb A1C  Screen for cholesterol -lipid panel  ADHD, inattenetive type -Rxs sent in for 3 months of refills for Adderall XR 30 mg daily.  New rxs start May 5, as pt has enough for April.  F/u in the next 6 months, sooner if needed.  Grier Mitts, MD

## 2017-12-12 ENCOUNTER — Other Ambulatory Visit: Payer: Self-pay | Admitting: Family Medicine

## 2017-12-12 DIAGNOSIS — Z1231 Encounter for screening mammogram for malignant neoplasm of breast: Secondary | ICD-10-CM

## 2018-01-11 ENCOUNTER — Ambulatory Visit: Payer: BLUE CROSS/BLUE SHIELD

## 2018-01-14 ENCOUNTER — Ambulatory Visit
Admission: RE | Admit: 2018-01-14 | Discharge: 2018-01-14 | Disposition: A | Discharge status: Home / Self Care | Payer: BLUE CROSS/BLUE SHIELD | Source: Ambulatory Visit | Attending: Family Medicine | Admitting: Family Medicine

## 2018-01-14 DIAGNOSIS — Z1231 Encounter for screening mammogram for malignant neoplasm of breast: Secondary | ICD-10-CM

## 2018-01-16 ENCOUNTER — Other Ambulatory Visit: Payer: Self-pay | Admitting: Family Medicine

## 2018-01-16 DIAGNOSIS — F900 Attention-deficit hyperactivity disorder, predominantly inattentive type: Secondary | ICD-10-CM

## 2018-01-16 NOTE — Telephone Encounter (Signed)
LOV was 10/08/2017 and last refill was 10/08/2017, pt requesting refill please Advise.

## 2018-01-17 ENCOUNTER — Other Ambulatory Visit: Payer: Self-pay | Admitting: Family Medicine

## 2018-01-17 DIAGNOSIS — F900 Attention-deficit hyperactivity disorder, predominantly inattentive type: Secondary | ICD-10-CM

## 2018-01-17 MED ORDER — AMPHETAMINE-DEXTROAMPHET ER 30 MG PO CP24: 30.0000 mg | ORAL_CAPSULE | ORAL | 0 refills | Status: DC

## 2018-01-17 NOTE — Telephone Encounter (Signed)
Pt LOV was 10/08/2017 and last refill was 10/08/2017 for 3 refills, please Advise

## 2018-02-20 ENCOUNTER — Encounter: Payer: Self-pay | Admitting: Family Medicine

## 2018-04-08 DIAGNOSIS — Z01419 Encounter for gynecological examination (general) (routine) without abnormal findings: Secondary | ICD-10-CM | POA: Diagnosis not present

## 2018-04-08 DIAGNOSIS — Z130 Encounter for screening for diseases of the blood and blood-forming organs and certain disorders involving the immune mechanism: Secondary | ICD-10-CM | POA: Diagnosis not present

## 2018-04-08 DIAGNOSIS — Z6831 Body mass index (BMI) 31.0-31.9, adult: Secondary | ICD-10-CM | POA: Diagnosis not present

## 2018-04-08 DIAGNOSIS — Z1389 Encounter for screening for other disorder: Secondary | ICD-10-CM | POA: Diagnosis not present

## 2018-05-07 DIAGNOSIS — M25561 Pain in right knee: Secondary | ICD-10-CM | POA: Diagnosis not present

## 2018-05-09 ENCOUNTER — Ambulatory Visit: Payer: BLUE CROSS/BLUE SHIELD | Admitting: Family Medicine

## 2018-05-29 ENCOUNTER — Other Ambulatory Visit: Payer: Self-pay | Admitting: Family Medicine

## 2018-05-29 DIAGNOSIS — F900 Attention-deficit hyperactivity disorder, predominantly inattentive type: Secondary | ICD-10-CM

## 2018-05-30 ENCOUNTER — Other Ambulatory Visit: Payer: Self-pay | Admitting: Family Medicine

## 2018-05-30 DIAGNOSIS — F900 Attention-deficit hyperactivity disorder, predominantly inattentive type: Secondary | ICD-10-CM

## 2018-05-30 NOTE — Telephone Encounter (Signed)
Pt last OV was 10/08/2017 and last refill was done on 01/17/2018 for 90 days supply, please advise

## 2018-05-31 MED ORDER — AMPHETAMINE-DEXTROAMPHET ER 30 MG PO CP24: 30.0000 mg | ORAL_CAPSULE | ORAL | 0 refills | Status: DC

## 2018-05-31 NOTE — Telephone Encounter (Signed)
Pt needs to be seen for further rx.

## 2018-05-31 NOTE — Telephone Encounter (Signed)
Called pt left a message for pt to call the office to schedule a medication review app

## 2018-06-03 NOTE — Telephone Encounter (Signed)
Rx has been refilled.  

## 2018-06-09 ENCOUNTER — Other Ambulatory Visit: Payer: Self-pay

## 2018-06-09 ENCOUNTER — Ambulatory Visit (HOSPITAL_COMMUNITY)
Admission: EM | Admit: 2018-06-09 | Discharge: 2018-06-09 | Disposition: A | Discharge status: Home / Self Care | Payer: BLUE CROSS/BLUE SHIELD | Attending: Emergency Medicine | Admitting: Emergency Medicine

## 2018-06-09 ENCOUNTER — Encounter (HOSPITAL_COMMUNITY): Payer: Self-pay

## 2018-06-09 DIAGNOSIS — F988 Other specified behavioral and emotional disorders with onset usually occurring in childhood and adolescence: Secondary | ICD-10-CM | POA: Insufficient documentation

## 2018-06-09 DIAGNOSIS — Z87891 Personal history of nicotine dependence: Secondary | ICD-10-CM | POA: Insufficient documentation

## 2018-06-09 DIAGNOSIS — F329 Major depressive disorder, single episode, unspecified: Secondary | ICD-10-CM | POA: Diagnosis not present

## 2018-06-09 DIAGNOSIS — J029 Acute pharyngitis, unspecified: Secondary | ICD-10-CM | POA: Diagnosis not present

## 2018-06-09 MED ORDER — IBUPROFEN 600 MG PO TABS: 600.0000 mg | ORAL_TABLET | Freq: Four times a day (QID) | ORAL | 0 refills | Status: DC | PRN

## 2018-06-09 MED ORDER — AMOXICILLIN 500 MG PO CAPS: 500.0000 mg | ORAL_CAPSULE | Freq: Two times a day (BID) | ORAL | 0 refills | Status: AC

## 2018-06-09 NOTE — Discharge Instructions (Signed)
Sore Throat  Your rapid strep tested Negative today. Given the way your throat looks, I am still going to treat you for tonsillitis with amoxicillin. Please take twice daily for 10 days.  Please continue Tylenol or Ibuprofen for fever and pain. May try salt water gargles, cepacol lozenges, throat spray, or OTC cold relief medicine for throat discomfort. If you also have congestion take a daily anti-histamine like Zyrtec, Claritin, and a oral decongestant to help with post nasal drip that may be irritating your throat.   Stay hydrated and drink plenty of fluids to keep your throat coated relieve irritation.

## 2018-06-09 NOTE — ED Triage Notes (Signed)
Pt presents today with sore throat, chills, white spots in the back of throat and fever that started yesterday. Had a fever of 102 this morning. Does have history of strep.

## 2018-06-09 NOTE — ED Provider Notes (Signed)
[facility]    CSN: [id number] Arrival date & time: 06/09/18  1400     History   Chief Complaint Chief Complaint  Patient presents with  . Sore Throat    HPI Paige Wilkinson is a 42 y.o. female history of previous appendectomy presenting today for evaluation of a sore throat and fever.  Patient states that for the past 2 days she has had a sore throat chills, body aches and fever.  Noting a fever up to 102 today.  She has been taking Tylenol and ibuprofen for this.  States that she typically has strep 2-3 times a year and this feels exactly like her symptoms in the past.  She has occasional postnasal drainage, but she will take a Zyrtec for this.  Denies associated rhinorrhea, congestion or cough.  Denies nausea or vomiting.  Has had a decrease in appetite.  HPI  Past Medical History:  Diagnosis Date  . Depression   . History of chicken pox     Patient Active Problem List   Diagnosis Date Noted  . ADD (attention deficit disorder) 10/18/2013  . UTI'S, HX OF 06/15/2010  . CHICKENPOX, HX OF 06/15/2010    Past Surgical History:  Procedure Laterality Date  . APPENDECTOMY    . TUBAL LIGATION      OB History   No obstetric history on file.      Home Medications    Prior to Admission medications   Medication Sig Start Date End Date Taking? Authorizing Provider  Ascorbic Acid (VITAMIN C) 1000 MG tablet Take 1,000 mg by mouth daily.   Yes [provider]  BIOTIN PO Take by mouth.   Yes [provider]  cyanocobalamin 500 MCG tablet Take 500 mcg by mouth daily.   Yes [provider]  amoxicillin (AMOXIL) 500 MG capsule Take 1 capsule (500 mg total) by mouth 2 (two) times daily for 10 days. 06/09/18 06/19/18  ,  C, PA-C  ibuprofen (ADVIL,MOTRIN) 600 MG tablet Take 1 tablet (600 mg total) by mouth every 6 (six) hours as needed. 06/09/18   , Junius Creamer C, PA-C    Family History No family history on file.  Social  History Social History   Tobacco Use  . Smoking status: Former Games developermoker  . Smokeless tobacco: Never Used  Substance Use Topics  . Alcohol use: Yes    Frequency: Never    Comment: rarely  . Drug use: No     Allergies   Patient has no known allergies.   Review of Systems Review of Systems  Constitutional: Positive for appetite change, fatigue and fever. Negative for activity change and chills.  HENT: Positive for sore throat. Negative for congestion, ear pain, rhinorrhea, sinus pressure and trouble swallowing.   Eyes: Negative for discharge and redness.  Respiratory: Negative for cough, chest tightness and shortness of breath.   Cardiovascular: Negative for chest pain.  Gastrointestinal: Negative for abdominal pain, diarrhea, nausea and vomiting.  Musculoskeletal: Positive for myalgias.  Skin: Negative for rash.  Neurological: Negative for dizziness, light-headedness and headaches.     Physical Exam Triage Vital Signs ED Triage Vitals  Enc Vitals Group     BP 06/09/18 1502 123/67     Pulse Rate 06/09/18 1502 74     Resp 06/09/18 1502 16     Temp 06/09/18 1502 98.1 F (36.7 C)     Temp Source 06/09/18 1502 Oral     SpO2 06/09/18 1502 100 %  Weight --      Height --      Head Circumference --      Peak Flow --      Pain Score 06/09/18 1503 6     Pain Loc --      Pain Edu? --      Excl. in GC? --    No data found.  Updated Vital Signs BP 123/67 (BP Location: Right Arm)   Pulse 74   Temp 98.1 F (36.7 C) (Oral)   Resp 16   SpO2 100%   Visual Acuity Right Eye Distance:   Left Eye Distance:   Bilateral Distance:    Right Eye Near:   Left Eye Near:    Bilateral Near:     Physical Exam Vitals signs and nursing note reviewed.  Constitutional:      General: She is not in acute distress.    Appearance: She is well-developed.  HENT:     Head: Normocephalic and atraumatic.     Ears:     Comments: Bilateral ears without tenderness to palpation of  external auricle, tragus and mastoid, EAC's without erythema or swelling, TM's with good bony landmarks and cone of light. Non erythematous.    Nose:     Comments: Nasal mucosa pink, no rhinorrhea present    Mouth/Throat:     Comments: Oral mucosa pink and moist, right tonsillar enlargement with erythema and exudate, left tonsil mildly enlarged, no exudate. Posterior pharynx patent and nonerythematous, no uvula deviation or swelling. Normal phonation. Eyes:     Conjunctiva/sclera: Conjunctivae normal.  Neck:     Musculoskeletal: Neck supple.     Comments: No lymphadenopathy Cardiovascular:     Rate and Rhythm: Normal rate and regular rhythm.     Heart sounds: No murmur.  Pulmonary:     Effort: Pulmonary effort is normal. No respiratory distress.     Breath sounds: Normal breath sounds.     Comments: Breathing comfortably at rest, CTABL, no wheezing, rales or other adventitious sounds auscultated Abdominal:     Palpations: Abdomen is soft.     Tenderness: There is no abdominal tenderness.  Skin:    General: Skin is warm and dry.  Neurological:     Mental Status: She is alert.      UC Treatments / Results  Labs (all labs ordered are listed, but only abnormal results are displayed) Labs Reviewed  CULTURE, GROUP A STREP [facility])    EKG None  Radiology No results found.  Procedures Procedures (including critical care time)  Medications Ordered in UC Medications - No data to display  Initial Impression / Assessment and Plan / UC Course  I have reviewed the triage vital signs and the nursing notes.  Pertinent labs & imaging results that were available during my care of the patient were reviewed by me and considered in my medical decision making (see chart for details).    Strep test negative, given appearance of tonsils and history, will treat for tonsillitis.  Will provide amoxicillin twice daily x10 days.  Discussed further symptomatic management of this as  well.Discussed strict return precautions. Patient verbalized understanding and is agreeable with plan.  Final Clinical Impressions(s) / UC Diagnoses   Final diagnoses:  Pharyngitis, unspecified etiology     Discharge Instructions     Sore Throat  Your rapid strep tested Negative today. Given the way your throat looks, I am still going to treat you for tonsillitis with amoxicillin. Please take twice daily  for 10 days.  Please continue Tylenol or Ibuprofen for fever and pain. May try salt water gargles, cepacol lozenges, throat spray, or OTC cold relief medicine for throat discomfort. If you also have congestion take a daily anti-histamine like Zyrtec, Claritin, and a oral decongestant to help with post nasal drip that may be irritating your throat.   Stay hydrated and drink plenty of fluids to keep your throat coated relieve irritation.     ED Prescriptions    Medication Sig Dispense Auth. Provider   amoxicillin (AMOXIL) 500 MG capsule Take 1 capsule (500 mg total) by mouth 2 (two) times daily for 10 days. 20 capsule ,  C, PA-C   ibuprofen (ADVIL,MOTRIN) 600 MG tablet Take 1 tablet (600 mg total) by mouth every 6 (six) hours as needed. 30 tablet , Lambert C, PA-C     Controlled Substance Prescriptions [place] Controlled Substance Registry consulted? Not Applicable   Lew Dawes, [place] 06/09/18 1546

## 2018-06-10 LAB — POCT RAPID STREP A: Streptococcus, Group A Screen (Direct): NEGATIVE

## 2018-06-11 LAB — CULTURE, GROUP A STREP (THRC)

## 2018-06-22 ENCOUNTER — Encounter (HOSPITAL_COMMUNITY): Payer: Self-pay

## 2018-06-22 ENCOUNTER — Ambulatory Visit (INDEPENDENT_AMBULATORY_CARE_PROVIDER_SITE_OTHER): Payer: BLUE CROSS/BLUE SHIELD

## 2018-06-22 ENCOUNTER — Ambulatory Visit (HOSPITAL_COMMUNITY)
Admission: EM | Admit: 2018-06-22 | Discharge: 2018-06-22 | Disposition: A | Discharge status: Home / Self Care | Payer: BLUE CROSS/BLUE SHIELD | Attending: Family Medicine | Admitting: Family Medicine

## 2018-06-22 ENCOUNTER — Other Ambulatory Visit: Payer: Self-pay

## 2018-06-22 DIAGNOSIS — M1711 Unilateral primary osteoarthritis, right knee: Secondary | ICD-10-CM

## 2018-06-22 DIAGNOSIS — M25561 Pain in right knee: Secondary | ICD-10-CM

## 2018-06-22 DIAGNOSIS — W0110XA Fall on same level from slipping, tripping and stumbling with subsequent striking against unspecified object, initial encounter: Secondary | ICD-10-CM | POA: Diagnosis not present

## 2018-06-22 DIAGNOSIS — S93401A Sprain of unspecified ligament of right ankle, initial encounter: Secondary | ICD-10-CM | POA: Diagnosis not present

## 2018-06-22 DIAGNOSIS — G8929 Other chronic pain: Secondary | ICD-10-CM | POA: Diagnosis not present

## 2018-06-22 MED ORDER — IBUPROFEN 600 MG PO TABS: 600.0000 mg | ORAL_TABLET | Freq: Four times a day (QID) | ORAL | 0 refills | Status: DC | PRN

## 2018-06-22 NOTE — Discharge Instructions (Addendum)
It was nice seeing you today. From my non-radiologic eye, knee xray looks fine. As discussed with you, if the radiology report is abnormal, I will contact you and let you know. In the mean time, use Ibuprofen as needed for pain. Return to the ED soon if symptoms worsen. See PCP soon for PT referral.  Ankle exam looks fine today so we deferred xray. Please see PCP soon if symptoms persist.  NB: Xray was available for review before the left the facility, contrary to the statement above. I reviewed and discussed the report with her prior to her leaving.

## 2018-06-22 NOTE — ED Provider Notes (Signed)
[facility]    CSN: [id number] Arrival date & time: 06/22/18  1429     History   Chief Complaint Chief Complaint  Patient presents with  . Ankle Pain  . Knee Pain    HPI Paige Wilkinson is a 42 y.o. female.   The history is provided by the patient. No language interpreter was used.  Ankle Pain  Location:  Knee and ankle Knee location:  R knee Ankle location:  R ankle Pain details:    Quality:  Aching and sharp   Severity:  Severe   Onset quality:  Sudden   Duration: Today.   Progression:  Waxing and waning Chronicity:  New Dislocation: no   Relieved by:  Rest Worsened by:  Bearing weight, extension, rotation and activity Ineffective treatments:  Ice and NSAIDs Associated symptoms: decreased ROM   Associated symptoms: no fever, no numbness, no stiffness, no swelling and no tingling   Knee Pain  Location:  Knee Injury: yes (While she was mopping the floor at home, she slipped, twisted her right ankle and landed on her right knee)   Knee location:  R knee Pain details:    Quality:  Sharp   Radiates to:  Does not radiate   Severity:  Severe   Onset quality:  Sudden   Timing:  Constant   Progression:  Worsening Chronicity:  Chronic (She has hx of DJD in her right knee diagnosed more than 1 year ago. She got a steroid shot in her knee joint few days ago. Since she fell on the right knee, her pain had worsened) Prior injury to area:  Unable to specify Worsened by:  Bearing weight, extension and activity Ineffective treatments:  NSAIDs Associated symptoms: decreased ROM   Associated symptoms: no fever, no numbness, no stiffness, no swelling and no tingling     Past Medical History:  Diagnosis Date  . Depression   . History of chicken pox     Patient Active Problem List   Diagnosis Date Noted  . ADD (attention deficit disorder) 10/18/2013  . UTI'S, HX OF 06/15/2010  . CHICKENPOX, HX OF 06/15/2010    Past Surgical History:  Procedure Laterality  Date  . APPENDECTOMY    . TUBAL LIGATION      OB History   No obstetric history on file.      Home Medications    Prior to Admission medications   Medication Sig Start Date End Date Taking? Authorizing Provider  Ascorbic Acid (VITAMIN C) 1000 MG tablet Take 1,000 mg by mouth daily.    [provider]  BIOTIN PO Take by mouth.    [provider]  cyanocobalamin 500 MCG tablet Take 500 mcg by mouth daily.    [provider]  ibuprofen (ADVIL,MOTRIN) 600 MG tablet Take 1 tablet (600 mg total) by mouth every 6 (six) hours as needed. 06/09/18   Wieters, Junius CreamerHallie C, PA-C    Family History History reviewed. No pertinent family history.  Social History Social History   Tobacco Use  . Smoking status: Former Games developermoker  . Smokeless tobacco: Never Used  Substance Use Topics  . Alcohol use: Yes    Frequency: Never    Comment: rarely  . Drug use: No     Allergies   Patient has no known allergies.   Review of Systems Review of Systems  Constitutional: Negative for fever.  Respiratory: Negative.   Cardiovascular: Negative.   Gastrointestinal: Negative.   Musculoskeletal: Positive for arthralgias. Negative for  joint swelling and stiffness.  Neurological: Negative.   All other systems reviewed and are negative.    Physical Exam Triage Vital Signs ED Triage Vitals  Enc Vitals Group     BP 06/22/18 1505 130/78     Pulse Rate 06/22/18 1505 88     Resp 06/22/18 1505 16     Temp 06/22/18 1505 98.1 F (36.7 C)     Temp src --      SpO2 06/22/18 1505 100 %     Weight 06/22/18 1503 184 lb (83.5 kg)     Height --      Head Circumference --      Peak Flow --      Pain Score 06/22/18 1503 8     Pain Loc --      Pain Edu? --      Excl. in GC? --    No data found.  Updated Vital Signs BP 130/78 (BP Location: Right Arm)   Pulse 88   Temp 98.1 F (36.7 C)   Resp 16   Wt 83.5 kg   LMP 06/19/2018   SpO2 100%   BMI 31.10 kg/m   Visual  Acuity Right Eye Distance:   Left Eye Distance:   Bilateral Distance:    Right Eye Near:   Left Eye Near:    Bilateral Near:     Physical Exam Vitals signs and nursing note reviewed.  Constitutional:      General: She is not in acute distress.    Appearance: Normal appearance.  Neck:     Musculoskeletal: Neck supple.  Cardiovascular:     Rate and Rhythm: Normal rate and regular rhythm.     Pulses: Normal pulses.     Heart sounds: No murmur.  Pulmonary:     Effort: Pulmonary effort is normal. No respiratory distress.     Breath sounds: No stridor. No wheezing or rhonchi.  Abdominal:     General: Abdomen is flat. Bowel sounds are normal. There is no distension.     Tenderness: There is no abdominal tenderness.  Musculoskeletal:     Right knee: She exhibits decreased range of motion. She exhibits no swelling, no effusion, no deformity and no erythema. Tenderness found.     Left knee: Normal.     Right ankle: She exhibits decreased range of motion. She exhibits no swelling, no ecchymosis, no deformity, no laceration and normal pulse. Tenderness.     Left ankle: Normal.     Comments: Ankle joint ROM reduced due to pain, otherwise, no deformity, able to bear weight and ambulate on her right foot, although painful.  More tender on her right knee. No swelling, no erythema of her knee  Neurological:     Mental Status: She is alert.      UC Treatments / Results  Labs (all labs ordered are listed, but only abnormal results are displayed) Labs Reviewed - No data to display  EKG None  Radiology No results found.  Procedures Procedures (including critical care time)  Medications Ordered in UC Medications - No data to display  Initial Impression / Assessment and Plan / UC Course  I have reviewed the triage vital signs and the nursing notes.  Pertinent labs & imaging results that were available during my care of the patient were reviewed by me and considered in my medical  decision making (see chart for details).  Clinical Course as of Jun 22 1620  Sat Jun 22, 2018  1615 Ankle  sprain. +tenderness, otherwise benign exam. Xray not indicated at this point. Ankle brace applied. Gradual weight bearing as tolerated. PCP f/u in 2 days for reassessment.   [KE]  1615 Right knee pain. Acute on chronic s/p fall. Xray report was available before she left the facility. I discussed report with her and gave her a copy of the xray report. I discussed need for an MRI to r/o malignancy. She verbalized understanding and will set up an appointment with her PCP soon. Knee brace applied.   [KE]    Clinical Course User Index [KE] Doreene ElandEniola, Chantry Headen T, MD    Acute pain of right knee  Osteoarthritis of right knee, unspecified osteoarthritis type  Sprain of right ankle, unspecified ligament, initial encounter   Final Clinical Impressions(s) / UC Diagnoses   Final diagnoses:  None   Discharge Instructions   None    ED Prescriptions    None     Controlled Substance Prescriptions  Controlled Substance Registry consulted? Not Applicable   Doreene ElandEniola, Takya Vandivier T, MD 06/22/18 [id number]

## 2018-06-22 NOTE — ED Triage Notes (Signed)
Pt cc she was cleaning and slipped and fell in the floor. Pt hurt her right knee and ankle.

## 2018-06-27 DIAGNOSIS — M2241 Chondromalacia patellae, right knee: Secondary | ICD-10-CM | POA: Diagnosis not present

## 2018-07-03 ENCOUNTER — Ambulatory Visit: Payer: BLUE CROSS/BLUE SHIELD | Admitting: Family Medicine

## 2018-07-03 ENCOUNTER — Encounter: Payer: Self-pay | Admitting: Family Medicine

## 2018-07-03 VITALS — BP 98/64 | HR 82 | Temp 98.7°F | Wt 183.0 lb

## 2018-07-03 DIAGNOSIS — M79604 Pain in right leg: Secondary | ICD-10-CM

## 2018-07-03 DIAGNOSIS — F900 Attention-deficit hyperactivity disorder, predominantly inattentive type: Secondary | ICD-10-CM | POA: Diagnosis not present

## 2018-07-03 MED ORDER — GABAPENTIN 100 MG PO CAPS: 100.0000 mg | ORAL_CAPSULE | Freq: Three times a day (TID) | ORAL | 3 refills | Status: DC

## 2018-07-03 MED ORDER — AMPHETAMINE-DEXTROAMPHET ER 30 MG PO CP24: 30.0000 mg | ORAL_CAPSULE | ORAL | 0 refills | Status: DC

## 2018-07-03 MED ORDER — AMPHETAMINE-DEXTROAMPHET ER 30 MG PO CP24
30.0000 mg | ORAL_CAPSULE | ORAL | 0 refills | Status: DC
Start: 2018-07-03 — End: 2018-09-23

## 2018-07-03 NOTE — Progress Notes (Signed)
Subjective:    Patient ID: Paige Wilkinson, female    DOB: [birth year], 43 y.o.   MRN: [id number]  No chief complaint on file.   HPI Patient was seen today for follow-up.  Pt was seen in urgent care on 12/28 s/p fall while mopping the floor.  Pt twisted her right ankle and fell on her right knee.  Imaging of right knee noted irregular bone calcification in the right femur.  Pt has an MRI scheduled next week to further evaluate the calcifications.  Pt endorses continued pain in her right knee/leg.  The pain is noted as deep, sharp, moving from right knee to right groin.  Pt endorses difficulty sleeping at night 2/2 the discomfort.  Pt taking ibuprofen with little relief.  Pt afraid to take anything else as is sensitive to medicines.    Prior to her fall pt was being seen by Delbert Harness for right knee pain that started after running.  Pt was diagnosed with osteomalacia patella and given prednisone injection.  X-rays at that time were normal otherwise.  Pt requesting refill on Adderall.  Past Medical History:  Diagnosis Date  . Depression   . History of chicken pox     No Known Allergies  ROS General: Denies fever, chills, night sweats, changes in weight, changes in appetite HEENT: Denies headaches, ear pain, changes in vision, rhinorrhea, sore throat CV: Denies CP, palpitations, SOB, orthopnea Pulm: Denies SOB, cough, wheezing GI: Denies abdominal pain, nausea, vomiting, diarrhea, constipation GU: Denies dysuria, hematuria, frequency, vaginal discharge Msk: Denies muscle cramps, joint pains + right knee and ankle pain s/p sprain. Neuro: Denies weakness, numbness, tingling Skin: Denies rashes, bruising Psych: Denies depression, anxiety, hallucinations    Objective:    Blood pressure 98/64, pulse 82, temperature 98.7 F (37.1 C), temperature source Oral, weight 183 lb (83 kg), last menstrual period 06/19/2018, SpO2 97 %.   Gen. Pleasant, well-nourished, in no distress, normal affect   HEENT: [place]/AT, face symmetric, no scleral icterus, PERRLA,  nares patent without drainage Lungs: no accessory muscle use, CTAB, no wheezes or rales Cardiovascular: RRR, no m/r/g, no peripheral edema Musculoskeletal: No deformities, no cyanosis or clubbing, normal tone Neuro:  A&Ox3, CN II-XII intact, normal gait Skin:  Warm, no lesions/ rash  Wt Readings from Last 3 Encounters:  07/03/18 183 lb (83 kg)  06/22/18 184 lb (83.5 kg)  10/08/17 180 lb (81.6 kg)    Lab Results  Component Value Date   WBC 7.2 10/08/2017   HGB 13.7 10/08/2017   HCT 40.8 10/08/2017   PLT 280.0 10/08/2017   GLUCOSE 81 10/08/2017   CHOL 125 10/08/2017   TRIG 72.0 10/08/2017   HDL 56.30 10/08/2017   LDLCALC 54 10/08/2017   ALT 13 (L) 05/05/2017   AST 21 05/05/2017   NA 136 10/08/2017   K 4.6 10/08/2017   CL 104 10/08/2017   CREATININE 0.80 10/08/2017   BUN 18 10/08/2017   CO2 26 10/08/2017   TSH 0.81 06/15/2010   HGBA1C 5.4 10/08/2017    Assessment/Plan:  Attention deficit hyperactivity disorder (ADHD), predominantly inattentive type  - Plan: amphetamine-dextroamphetamine (ADDERALL XR) 30 MG 24 hr capsule, amphetamine-dextroamphetamine (ADDERALL XR) 30 MG 24 hr capsule, amphetamine-dextroamphetamine (ADDERALL XR) 30 MG 24 hr capsule, Pain Mgmt, Profile 8 w/Conf, U  Right leg pain  -advised to continue f/u with ortho -pt encouraged to keep upcoming appt for MRI -discussed trying gabapentin for continued pain. - Plan: gabapentin (NEURONTIN) 100 MG capsule, Pain Mgmt, Profile 8  w/Conf, U  F/u prn  Abbe [place], MD

## 2018-07-05 ENCOUNTER — Ambulatory Visit: Payer: BLUE CROSS/BLUE SHIELD | Admitting: Family Medicine

## 2018-07-06 LAB — PAIN MGMT, PROFILE 8 W/CONF, U
6 Acetylmorphine: NEGATIVE ng/mL (ref <10)
Alcohol Metabolites: NEGATIVE ng/mL (ref <500)
Amphetamine: 3026 ng/mL — ABNORMAL HIGH (ref <250)
Amphetamines: POSITIVE ng/mL — AB (ref <500)
Benzodiazepines: NEGATIVE ng/mL (ref <100)
Buprenorphine, Urine: NEGATIVE ng/mL (ref <5)
Cocaine Metabolite: NEGATIVE ng/mL (ref <150)
Creatinine: 114.5 mg/dL
MDMA: NEGATIVE ng/mL (ref <500)
Marijuana Metabolite: 22 ng/mL — ABNORMAL HIGH (ref <5)
Marijuana Metabolite: POSITIVE ng/mL — AB (ref <20)
Methamphetamine: NEGATIVE ng/mL (ref <250)
Opiates: NEGATIVE ng/mL (ref <100)
Oxidant: NEGATIVE ug/mL (ref <200)
Oxycodone: NEGATIVE ng/mL (ref <100)
pH: 6.17 (ref 4.5–9.0)

## 2018-07-08 ENCOUNTER — Encounter: Payer: Self-pay | Admitting: Family Medicine

## 2018-07-11 DIAGNOSIS — M25561 Pain in right knee: Secondary | ICD-10-CM | POA: Diagnosis not present

## 2018-07-22 ENCOUNTER — Encounter: Payer: Self-pay | Admitting: Family Medicine

## 2018-07-22 ENCOUNTER — Ambulatory Visit: Payer: Self-pay

## 2018-07-22 ENCOUNTER — Ambulatory Visit (INDEPENDENT_AMBULATORY_CARE_PROVIDER_SITE_OTHER): Payer: BLUE CROSS/BLUE SHIELD | Admitting: Family Medicine

## 2018-07-22 ENCOUNTER — Encounter: Payer: Self-pay | Admitting: *Deleted

## 2018-07-22 VITALS — BP 120/84 | HR 87 | Temp 98.7°F | Resp 12 | Ht 64.5 in | Wt 182.2 lb

## 2018-07-22 DIAGNOSIS — R112 Nausea with vomiting, unspecified: Secondary | ICD-10-CM

## 2018-07-22 DIAGNOSIS — R1012 Left upper quadrant pain: Secondary | ICD-10-CM | POA: Diagnosis not present

## 2018-07-22 DIAGNOSIS — K5900 Constipation, unspecified: Secondary | ICD-10-CM | POA: Diagnosis not present

## 2018-07-22 DIAGNOSIS — R509 Fever, unspecified: Secondary | ICD-10-CM | POA: Diagnosis not present

## 2018-07-22 LAB — POCT INFLUENZA A/B
Influenza A, POC: NEGATIVE
Influenza B, POC: NEGATIVE

## 2018-07-22 MED ORDER — ONDANSETRON HCL 4 MG PO TABS: 4.0000 mg | ORAL_TABLET | Freq: Three times a day (TID) | ORAL | 0 refills | Status: AC | PRN

## 2018-07-22 MED ORDER — LACTULOSE 10 GM/15ML PO SOLN: 10.0000 g | Freq: Every day | ORAL | 0 refills | Status: AC | PRN

## 2018-07-22 NOTE — Telephone Encounter (Signed)
Pt returned my call She states she has had upper abdominal pain to the left of her abdomin since last Wednesday  She has vomited and yellow.  She states she has not been able to eat much. She has had trouble drinking acidic drinks.  She has tried treating with Mylanta, Pepto bismol, and Prilosec.  She feels chilled and ache but states she has no fever.  She states she may have just a little swelling of her abdomin. Appointment scheduled per protocol.  Care advice read to patient. Pt verbalized understanding of all instructions.  Reason for Disposition . [1] MILD-MODERATE pain AND [2] not relieved by antacids  Answer Assessment - Initial Assessment Questions 1. LOCATION: "Where does it hurt?"      Upper left 2. RADIATION: "Does the pain shoot anywhere else?" (e.g., chest, back)     Back lower need to have BM 3. ONSET: "When did the pain begin?" (e.g., minutes, hours or days ago)     wednesday 4. SUDDEN: "Gradual or sudden onset?"     gradual 5. PATTERN "Does the pain come and go, or is it constant?"    - If constant: "Is it getting better, staying the same, or worsening?"      (Note: Constant means the pain never goes away completely; most serious pain is constant and it progresses)     - If intermittent: "How long does it last?" "Do you have pain now?"     (Note: Intermittent means the pain goes away completely between bouts)     Can't drink coffee 6. SEVERITY: "How bad is the pain?"  (e.g., Scale 1-10; mild, moderate, or severe)    - MILD (1-3): doesn't interfere with normal activities, abdomen soft and not tender to touch     - MODERATE (4-7): interferes with normal activities or awakens from sleep, tender to touch     - SEVERE (8-10): excruciating pain, doubled over, unable to do any normal activities       Upper left 7. RECURRENT SYMPTOM: "Have you ever had this type of abdominal pain before?" If so, ask: "When was the last time?" and "What happened that time?"      No last BM Friday  evening and has been vomiting 8. AGGRAVATING FACTORS: "Does anything seem to cause this pain?" (e.g., foods, stress, alcohol)     Food acidic beverages 9. CARDIAC SYMPTOMS: "Do you have any of the following symptoms: chest pain, difficulty breathing, sweating, nausea?"    no 10. OTHER SYMPTOMS: "Do you have any other symptoms?" (e.g., fever, vomiting, diarrhea)       Vomiting chills body aches 11. PREGNANCY: "Is there any chance you are pregnant?" "When was your last menstrual period?"       No tubes tied  Protocols used: ABDOMINAL PAIN - UPPER-A-AH

## 2018-07-22 NOTE — Patient Instructions (Addendum)
A few things to remember from today's visit:   Left upper quadrant pain - Plan: Basic metabolic panel, CBC with Differential/Platelet, Urinalysis, Routine w reflex microscopic  Constipation, unspecified constipation type  Fever, unspecified fever cause  Nausea and vomiting in adult - Plan: ondansetron (ZOFRAN) 4 MG tablet  Bland diet and a small portions. Take lactulose daily until you have a good bowel movement. Monitor for new symptoms, you started with fever, so you can develop upper respiratory or more gastrointestinal symptoms. Adequate hydration.    Small and frequent sips of clear fluids, Pedialyte or Gatorade age good options.  BRAT diet: bananas, rice, applesauce, and toast.  If persistent vomiting stop solids for 24 hours then resume liquid/soft diet and advance as tolerated.   Notified immediately or seek medical attention if you cannot keep fluids down, decreased urine output, severe abdominal pain, red bright blood per rectum, or changes in mental status.       Please be sure medication list is accurate. If a new problem present, please set up appointment sooner than planned today.

## 2018-07-22 NOTE — Telephone Encounter (Signed)
Noted  

## 2018-07-22 NOTE — Progress Notes (Signed)
ACUTE VISIT   HPI:  Chief Complaint  Patient presents with  . Emesis    yesterday, not all the time  . Upper Abdominal Pain  . Back Pain  . Nausea  . Gastroesophageal Reflux    Ms.Paige Wilkinson is a 43 y.o. female, who is here today complaining of 5 days of LUQ achy/pressure like abdominal pain. Pain is radiated to left-side back. Nausea and vomiting yesterday,exacerbated by food intake. No urinary symptoms. No Hx of nephrolithiasis.  Hx of GERD,has not had symptoms since she changes dietary habits.  No numbness or tingling. No skin rash.  + Constipation,she usually has bowel movements daily but fir the past 5 days she has 2 bowel movements, last one this morning,hard stool. She has not noted melena or blood in stool.  Pain alleviated by defecation and exacerbated by food intake. She is feeling better today. Pain is now 2-3/10,it was 8/10.  Today she started with body aches,fever 101.0 F. 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 She "always" wears a mask while working. No recent travel. No respiratory symptoms.  She took Tylenol today.   Review of Systems  Constitutional: Positive for activity change, appetite change, chills, fatigue and fever.  HENT: Negative for congestion, mouth sores, postnasal drip, rhinorrhea, sore throat and trouble swallowing.   Respiratory: Negative for cough, shortness of breath and wheezing.   Gastrointestinal: Positive for abdominal pain, constipation, nausea and vomiting. Negative for blood in stool.  Genitourinary: Negative for decreased urine volume, dysuria and hematuria.  Musculoskeletal: Positive for back pain and myalgias. Negative for gait problem.  Skin: Negative for color change and rash.  Neurological: Negative for syncope, weakness and headaches.  Hematological: Negative for adenopathy. Does not bruise/bleed easily.      Current Outpatient Medications on File Prior to Visit  Medication Sig Dispense  Refill  . amphetamine-dextroamphetamine (ADDERALL XR) 30 MG 24 hr capsule Take 1 capsule (30 mg total) by mouth every morning. 30 capsule 0  . amphetamine-dextroamphetamine (ADDERALL XR) 30 MG 24 hr capsule Take 1 capsule (30 mg total) by mouth every morning. 30 capsule 0  . amphetamine-dextroamphetamine (ADDERALL XR) 30 MG 24 hr capsule Take 1 capsule (30 mg total) by mouth every morning. 30 capsule 0  . Ascorbic Acid (VITAMIN C) 1000 MG tablet Take 1,000 mg by mouth daily.    Marland Kitchen BIOTIN PO Take by mouth.    . cyanocobalamin 500 MCG tablet Take 500 mcg by mouth daily.    Marland Kitchen ibuprofen (ADVIL,MOTRIN) 600 MG tablet Take 1 tablet (600 mg total) by mouth every 6 (six) hours as needed. 30 tablet 0   No current facility-administered medications on file prior to visit.      Past Medical History:  Diagnosis Date  . Depression   . History of chicken pox    No Known Allergies  Social History   Socioeconomic History  . Marital status: Married    Spouse name: Not on file  . Number of children: Not on file  . Years of education: Not on file  . Highest education level: Not on file  Occupational History  . Not on file  Social Needs  . Financial resource strain: Not on file  . Food insecurity:    Worry: Not on file    Inability: Not on file  . Transportation needs:    Medical: Not on file    Non-medical: Not on file  Tobacco Use  . Smoking  status: Former Games developermoker  . Smokeless tobacco: Never Used  Substance and Sexual Activity  . Alcohol use: Yes    Frequency: Never    Comment: rarely  . Drug use: No  . Sexual activity: Not on file  Lifestyle  . Physical activity:    Days per week: Not on file    Minutes per session: Not on file  . Stress: Not on file  Relationships  . Social connections:    Talks on phone: Not on file    Gets together: Not on file    Attends religious service: Not on file    Active member of club or organization: Not on file    Attends meetings of clubs or  organizations: Not on file    Relationship status: Not on file  Other Topics Concern  . Not on file  Social History Narrative  . Not on file    Vitals:   07/22/18 1549  BP: 120/84  Pulse: 87  Resp: 12  Temp: 98.7 F (37.1 C)  SpO2: 98%   Body mass index is 30.8 kg/m.    Physical Exam  Nursing note and vitals reviewed. Constitutional: She is oriented to person, place, and time. She appears well-developed. She does not appear ill. No distress.  HENT:  Head: Normocephalic and atraumatic.  Mouth/Throat: Oropharynx is clear and moist. Mucous membranes are dry.  Eyes: Conjunctivae are normal. No scleral icterus.  Cardiovascular: Normal rate and regular rhythm.  No murmur heard. Respiratory: Effort normal and breath sounds normal. No respiratory distress.  GI: Soft. Bowel sounds are normal. She exhibits no distension and no mass. There is no hepatomegaly. There is abdominal tenderness in the left upper quadrant. There is no rigidity, no rebound, no guarding and no CVA tenderness.    Musculoskeletal:        General: No edema.     Lumbar back: She exhibits no tenderness and no bony tenderness.  Lymphadenopathy:    She has no cervical adenopathy.  Neurological: She is alert and oriented to person, place, and time. She has normal strength. Gait normal.  Skin: Skin is warm. No rash noted. No erythema.  Psychiatric: Her mood appears anxious.  Well groomed, good eye contact.      ASSESSMENT AND PLAN:  Ms. Paige Wilkinson was seen today for emesis, upper abdominal pain, back pain, nausea and gastroesophageal reflux.  Orders Placed This Encounter  Procedures  . Basic metabolic panel  . CBC with Differential/Platelet  . Urinalysis, Routine w reflex microscopic  . POC Influenza A/B    Left upper quadrant pain Possible etiologies discussed. ? Constipation most likely. Improving,so I do not think imaging is needed.  -     Basic metabolic panel -     CBC with  Differential/Platelet -     Urinalysis, Routine w reflex microscopic  Constipation, unspecified constipation type -     lactulose (CHRONULAC) 10 GM/15ML solution; Take 15 mLs (10 g total) by mouth daily as needed for up to 7 days for mild constipation.  Fever, unspecified fever cause I do not think this problem is related to GI symptoms. Today rapid flu negative. Monitor for new symptoms. Work excuse given. Tylenol 500 mg qid prn.  -     POC Influenza A/B  Nausea and vomiting in adult Adequate hydration. Small and frequent sips of clear fluids. Instructed about warning signs.  -     ondansetron (ZOFRAN) 4 MG tablet; Take 1 tablet (4 mg total) by mouth every  8 (eight) hours as needed for up to 4 days for nausea or vomiting. -     POC Influenza A/B     Return in about 1 week (around 07/29/2018) for abd pain with PCP.      Betty G. [place], MD  [facility]. [place] office.

## 2018-07-22 NOTE — Telephone Encounter (Signed)
Comments: I have upper abdomen pain, nausea and vomiting.  Vomiting is not constant. Feel I like I need to have a Bowel movement, but has not pass a BM in 2 days. Today I feel weak and very nausea. Loss appetite  Left VM to return call to office.

## 2018-07-23 ENCOUNTER — Encounter: Payer: Self-pay | Admitting: Family Medicine

## 2018-07-23 LAB — BASIC METABOLIC PANEL
BUN: 19 mg/dL (ref 6–23)
CO2: 25 mEq/L (ref 19–32)
Calcium: 9.6 mg/dL (ref 8.4–10.5)
Chloride: 103 mEq/L (ref 96–112)
Creatinine, Ser: 0.96 mg/dL (ref 0.40–1.20)
GFR: 76.75 mL/min (ref >60.00)
Glucose, Bld: 84 mg/dL (ref 70–99)
Potassium: 4.2 mEq/L (ref 3.5–5.1)
Sodium: 138 mEq/L (ref 135–145)

## 2018-07-23 LAB — CBC WITH DIFFERENTIAL/PLATELET
Basophils Absolute: 0.1 10*3/uL (ref 0.0–0.1)
Basophils Relative: 1 % (ref 0.0–3.0)
Eosinophils Absolute: 0.1 10*3/uL (ref 0.0–0.7)
Eosinophils Relative: 0.6 % (ref 0.0–5.0)
HCT: 43.3 % (ref 36.0–46.0)
Hemoglobin: 14 g/dL (ref 12.0–15.0)
Lymphocytes Relative: 34.1 % (ref 12.0–46.0)
Lymphs Abs: 3.6 10*3/uL (ref 0.7–4.0)
MCHC: 32.4 g/dL (ref 30.0–36.0)
MCV: 89 fl (ref 78.0–100.0)
Monocytes Absolute: 0.8 10*3/uL (ref 0.1–1.0)
Monocytes Relative: 7.3 % (ref 3.0–12.0)
Neutro Abs: 6 10*3/uL (ref 1.4–7.7)
Neutrophils Relative %: 57 % (ref 43.0–77.0)
Platelets: 349 10*3/uL (ref 150.0–400.0)
RBC: 4.87 Mil/uL (ref 3.87–5.11)
RDW: 12.6 % (ref 11.5–15.5)
WBC: 10.6 10*3/uL — ABNORMAL HIGH (ref 4.0–10.5)

## 2018-07-23 LAB — URINALYSIS, ROUTINE W REFLEX MICROSCOPIC
Bilirubin Urine: NEGATIVE
Hgb urine dipstick: NEGATIVE
Ketones, ur: NEGATIVE
Leukocytes, UA: NEGATIVE
Nitrite: NEGATIVE
RBC / HPF: NONE SEEN (ref 0–?)
Specific Gravity, Urine: >=1.03 — AB (ref 1.000–1.030)
Total Protein, Urine: NEGATIVE
Urine Glucose: NEGATIVE
Urobilinogen, UA: 0.2 (ref 0.0–1.0)
WBC, UA: NONE SEEN (ref 0–?)
pH: 5.5 (ref 5.0–8.0)

## 2018-07-25 ENCOUNTER — Encounter: Payer: Self-pay | Admitting: Family Medicine

## 2018-07-26 NOTE — Telephone Encounter (Signed)
Copied from CRM [id number]. Topic: Quick Communication - See Telephone Encounter >> Jul 26, 2018  1:34 PM Terisa Starr wrote: CRM for notification. See Telephone encounter for: 07/26/18.  Pt is requesting her lab results. Please advise

## 2018-07-27 ENCOUNTER — Encounter: Payer: Self-pay | Admitting: Family Medicine

## 2018-08-05 ENCOUNTER — Ambulatory Visit: Payer: BLUE CROSS/BLUE SHIELD | Admitting: Family Medicine

## 2018-08-21 ENCOUNTER — Ambulatory Visit: Payer: BLUE CROSS/BLUE SHIELD | Admitting: Family Medicine

## 2018-08-21 ENCOUNTER — Other Ambulatory Visit (HOSPITAL_COMMUNITY)
Admission: RE | Admit: 2018-08-21 | Discharge: 2018-08-21 | Disposition: A | Discharge status: Home / Self Care | Payer: BLUE CROSS/BLUE SHIELD | Source: Ambulatory Visit | Attending: Family Medicine | Admitting: Family Medicine

## 2018-08-21 ENCOUNTER — Encounter: Payer: Self-pay | Admitting: Family Medicine

## 2018-08-21 VITALS — BP 120/78 | HR 84 | Temp 97.8°F | Wt 180.0 lb

## 2018-08-21 DIAGNOSIS — I8393 Asymptomatic varicose veins of bilateral lower extremities: Secondary | ICD-10-CM | POA: Diagnosis not present

## 2018-08-21 DIAGNOSIS — N760 Acute vaginitis: Secondary | ICD-10-CM

## 2018-08-21 LAB — POC URINALSYSI DIPSTICK (AUTOMATED)
Bilirubin, UA: NEGATIVE
Blood, UA: NEGATIVE
Glucose, UA: NEGATIVE
Ketones, UA: NEGATIVE
Leukocytes, UA: NEGATIVE
Nitrite, UA: NEGATIVE
Protein, UA: NEGATIVE
Spec Grav, UA: 1.015 (ref 1.010–1.025)
Urobilinogen, UA: 0.2 E.U./dL
pH, UA: 6.5 (ref 5.0–8.0)

## 2018-08-21 MED ORDER — FLUCONAZOLE 150 MG PO TABS: ORAL_TABLET | ORAL | 0 refills | Status: DC

## 2018-08-21 NOTE — Progress Notes (Signed)
Subjective:    Patient ID: Paige Wilkinson, female    DOB: 02-Dec-1975, 43 y.o.   MRN: [id number]  No chief complaint on file.   HPI Patient was seen today for acute concern.  Pt states she had a long car trip to [place] over the wknd to visit her friend in hospice.  Pt states she was wearing jeans in the car during part of the trip.  She now has vaginal irritation, thick white d/c, burning with urination, and urinary frequency (though drinking more water).  Pt denies fever, chills, n/v, back pain.  Pt notes R knee pain.  If followed by Ortho.  S/p steroid injection to R knee.  States injection lasted 2 wks before the pain returned.  Pt feels like back of knee is swollen at times.  States had MRI, but does not require surgery at this time.  Also with varicose veins b/l.  Past Medical History:  Diagnosis Date  . Depression   . History of chicken pox     No Known Allergies  ROS General: Denies fever, chills, night sweats, changes in weight, changes in appetite HEENT: Denies headaches, ear pain, changes in vision, rhinorrhea, sore throat CV: Denies CP, palpitations, SOB, orthopnea Pulm: Denies SOB, cough, wheezing GI: Denies abdominal pain, nausea, vomiting, diarrhea, constipation GU: Denies dysuria, hematuria   + frequency, vaginal discharge, vaginal irritation Msk: Denies muscle cramps, joint pains  +R knee pain, varicose veins Neuro: Denies weakness, numbness, tingling Skin: Denies rashes, bruising Psych: Denies depression, anxiety, hallucinations   Objective:    Blood pressure 120/78, pulse 84, temperature 97.8 F (36.6 C), temperature source Oral, weight 180 lb (81.6 kg), SpO2 99 %.  Gen. Pleasant, well-nourished, in no distress, normal affect   HEENT: [place]/AT, face symmetric, no scleral icterus, PERRLA, nares patent without drainage Lungs: no accessory muscle use, CTAB, no wheezes or rales Cardiovascular: RRR, no m/r/g, no peripheral edema Abdomen: BS present, soft, NT/ND, no  hepatosplenomegaly.  No CVA tenderness.  Vaginal self swab done by pt. MSK: varicose veins noted, TTP of R medial knee at joint line.  No edema, erythema, or crepitus noted.  Pt wearing a knee brace. Neuro:  A&Ox3, CN II-XII intact, normal gait Skin:  Warm, no lesions/ rash   Wt Readings from Last 3 Encounters:  07/22/18 182 lb 4 oz (82.7 kg)  07/03/18 183 lb (83 kg)  06/22/18 184 lb (83.5 kg)    Lab Results  Component Value Date   WBC 10.6 (H) 07/22/2018   HGB 14.0 07/22/2018   HCT 43.3 07/22/2018   PLT 349.0 07/22/2018   GLUCOSE 84 07/22/2018   CHOL 125 10/08/2017   TRIG 72.0 10/08/2017   HDL 56.30 10/08/2017   LDLCALC 54 10/08/2017   ALT 13 (L) 05/05/2017   AST 21 05/05/2017   NA 138 07/22/2018   K 4.2 07/22/2018   CL 103 07/22/2018   CREATININE 0.96 07/22/2018   BUN 19 07/22/2018   CO2 25 07/22/2018   TSH 0.81 06/15/2010   HGBA1C 5.4 10/08/2017    Assessment/Plan:  Acute vaginitis  -UA negative  SG 1.015, neg leuks, RBCs, glucose -given handout - Plan: fluconazole (DIFLUCAN) 150 MG tablet, Cervicovaginal ancillary only   Asymptomatic varicose veins of both lower extremities -Given handout to obtain TED hose -Discussed wearing supportive shoes and decrease the amount of time standing when possible.  F/u prn  Abbe Amsterdam, MD

## 2018-08-21 NOTE — Addendum Note (Signed)
Addended by: Carola Rhine on: 08/21/2018 10:50 AM   Modules accepted: Orders

## 2018-08-21 NOTE — Patient Instructions (Signed)
Vaginitis  Vaginitis is a condition in which the vaginal tissue swells and becomes red (inflamed). This condition is most often caused by a change in the normal balance of bacteria and yeast that live in the vagina. This change causes an overgrowth of certain bacteria or yeast, which causes the inflammation. There are different types of vaginitis, but the most common types are:   Bacterial vaginosis.   Yeast infection (candidiasis).   Trichomoniasis vaginitis. This is a sexually transmitted disease (STD).   Viral vaginitis.   Atrophic vaginitis.   Allergic vaginitis.  What are the causes?  The cause of this condition depends on the type of vaginitis. It can be caused by:   Bacteria (bacterial vaginosis).   Yeast, which is a fungus (yeast infection).   A parasite (trichomoniasis vaginitis).   A virus (viral vaginitis).   Low hormone levels (atrophic vaginitis). Low hormone levels can occur during pregnancy, breastfeeding, or after menopause.   Irritants, such as bubble baths, scented tampons, and feminine sprays (allergic vaginitis).  Other factors can change the normal balance of the yeast and bacteria that live in the vagina. These include:   Antibiotic medicines.   Poor hygiene.   Diaphragms, vaginal sponges, spermicides, birth control pills, and intrauterine devices (IUD).   Sex.   Infection.   Uncontrolled diabetes.   A weakened defense (immune) system.  What increases the risk?  This condition is more likely to develop in women who:   Smoke.   Use vaginal douches, scented tampons, or scented sanitary pads.   Wear tight-fitting pants.   Wear thong underwear.   Use oral birth control pills or an IUD.   Have sex without a condom.   Have multiple sex partners.   Have an STD.   Frequently use the spermicide nonoxynol-9.   Eat lots of foods high in sugar.   Have uncontrolled diabetes.   Have low estrogen levels.   Have a weakened immune system from an immune disorder or medical  treatment.   Are pregnant or breastfeeding.  What are the signs or symptoms?  Symptoms vary depending on the cause of the vaginitis. Common symptoms include:   Abnormal vaginal discharge.  ? The discharge is white, gray, or yellow with bacterial vaginosis.  ? The discharge is thick, white, and cheesy with a yeast infection.  ? The discharge is frothy and yellow or greenish with trichomoniasis.   A bad vaginal smell. The smell is fishy with bacterial vaginosis.   Vaginal itching, pain, or swelling.   Sex that is painful.   Pain or burning when urinating.  Sometimes there are no symptoms.  How is this diagnosed?  This condition is diagnosed based on your symptoms and medical history. A physical exam, including a pelvic exam, will also be done. You may also have other tests, including:   Tests to determine the pH level (acidity or alkalinity) of your vagina.   A whiff test, to assess the odor that results when a sample of your vaginal discharge is mixed with a potassium hydroxide solution.   Tests of vaginal fluid. A sample will be examined under a microscope.  How is this treated?  Treatment varies depending on the type of vaginitis you have. Your treatment may include:   Antibiotic creams or pills to treat bacterial vaginosis and trichomoniasis.   Antifungal medicines, such as vaginal creams or suppositories, to treat a yeast infection.   Medicine to ease discomfort if you have viral vaginitis. Your sexual partner   should also be treated.   Estrogen delivered in a cream, pill, suppository, or vaginal ring to treat atrophic vaginitis. If vaginal dryness occurs, lubricants and moisturizing creams may help. You may need to avoid scented soaps, sprays, or douches.   Stopping use of a product that is causing allergic vaginitis. Then using a vaginal cream to treat the symptoms.  Follow these instructions at home:  Lifestyle   Keep your genital area clean and dry. Avoid soap, and only rinse the area with  water.   Do not douche or use tampons until your health care provider says it is okay to do so. Use sanitary pads, if needed.   Do not have sex until your health care provider approves. When you can return to sex, practice safe sex and use condoms.   Wipe from front to back. This avoids the spread of bacteria from the rectum to the vagina.  General instructions   Take over-the-counter and prescription medicines only as told by your health care provider.   If you were prescribed an antibiotic medicine, take or use it as told by your health care provider. Do not stop taking or using the antibiotic even if you start to feel better.   Keep all follow-up visits as told by your health care provider. This is important.  How is this prevented?   Use mild, non-scented products. Do not use things that can irritate the vagina, such as fabric softeners. Avoid the following products if they are scented:  ? Feminine sprays.  ? Detergents.  ? Tampons.  ? Feminine hygiene products.  ? Soaps or bubble baths.   Let air reach your genital area.  ? Wear cotton underwear to reduce moisture buildup.  ? Avoid wearing underwear while you sleep.  ? Avoid wearing tight pants and underwear or nylons without a cotton panel.  ? Avoid wearing thong underwear.   Take off any wet clothing, such as bathing suits, as soon as possible.   Practice safe sex and use condoms.  Contact a health care provider if:   You have abdominal pain.   You have a fever.   You have symptoms that last for more than 2-3 days.  Get help right away if:   You have a fever and your symptoms suddenly get worse.  Summary   Vaginitis is a condition in which the vaginal tissue becomes inflamed.This condition is most often caused by a change in the normal balance of bacteria and yeast that live in the vagina.   Treatment varies depending on the type of vaginitis you have.   Do not douche, use tampons , or have sex until your health care provider approves. When  you can return to sex, practice safe sex and use condoms.  This information is not intended to replace advice given to you by your health care provider. Make sure you discuss any questions you have with your health care provider.  Document Released: 04/09/2007 Document Revised: 07/18/2016 Document Reviewed: 07/18/2016  Elsevier Interactive Patient Education  2019 Elsevier Inc.

## 2018-08-22 LAB — CERVICOVAGINAL ANCILLARY ONLY
Bacterial vaginitis: POSITIVE — AB
Candida vaginitis: POSITIVE — AB
Chlamydia: NEGATIVE
Neisseria Gonorrhea: NEGATIVE
Trichomonas: NEGATIVE

## 2018-08-23 ENCOUNTER — Other Ambulatory Visit: Payer: Self-pay | Admitting: Family Medicine

## 2018-08-23 MED ORDER — METRONIDAZOLE 500 MG PO TABS: 500.0000 mg | ORAL_TABLET | Freq: Two times a day (BID) | ORAL | 0 refills | Status: AC

## 2018-09-23 ENCOUNTER — Other Ambulatory Visit: Payer: Self-pay | Admitting: Family Medicine

## 2018-09-23 ENCOUNTER — Encounter: Payer: Self-pay | Admitting: Family Medicine

## 2018-09-23 DIAGNOSIS — F900 Attention-deficit hyperactivity disorder, predominantly inattentive type: Secondary | ICD-10-CM

## 2018-09-24 NOTE — Telephone Encounter (Signed)
Pt LOV  Was on 08/21/2018 and last refill was done on 07/03/2018, please advise

## 2018-09-30 MED ORDER — AMPHETAMINE-DEXTROAMPHET ER 30 MG PO CP24: 30.0000 mg | ORAL_CAPSULE | ORAL | 0 refills | Status: DC

## 2018-10-01 ENCOUNTER — Ambulatory Visit (INDEPENDENT_AMBULATORY_CARE_PROVIDER_SITE_OTHER): Payer: BLUE CROSS/BLUE SHIELD | Admitting: Family Medicine

## 2018-10-01 ENCOUNTER — Encounter: Payer: Self-pay | Admitting: Family Medicine

## 2018-10-01 ENCOUNTER — Other Ambulatory Visit: Payer: Self-pay

## 2018-10-01 DIAGNOSIS — B029 Zoster without complications: Secondary | ICD-10-CM

## 2018-10-01 DIAGNOSIS — F900 Attention-deficit hyperactivity disorder, predominantly inattentive type: Secondary | ICD-10-CM | POA: Diagnosis not present

## 2018-10-01 DIAGNOSIS — H6980 Other specified disorders of Eustachian tube, unspecified ear: Secondary | ICD-10-CM | POA: Diagnosis not present

## 2018-10-01 MED ORDER — FLUTICASONE PROPIONATE 50 MCG/ACT NA SUSP: 1.0000 | Freq: Every day | NASAL | 4 refills | Status: DC

## 2018-10-01 MED ORDER — AMPHETAMINE-DEXTROAMPHET ER 30 MG PO CP24: 30.0000 mg | ORAL_CAPSULE | ORAL | 0 refills | Status: DC

## 2018-10-01 NOTE — Progress Notes (Signed)
Virtual Visit via Video Note  I connected with Paige Wilkinson on 10/01/18 at  3:30 PM EDT by a video enabled telemedicine application and verified that I am speaking with the correct person using two identifiers.  Location patient: work (physician's office) Location provider: home office Persons participating in the virtual visit: patient, provider  I discussed the limitations of evaluation and management by telemedicine and the availability of in person appointments. The patient expressed understanding and agreed to proceed.    HPI:  Using zyrtec and flonase but still having trouble with ears.  Has taken zyrtec x yrs.  Feels like it helps as she does not have watery/itchy eyes.  Feels like fluid in ears and constant popping.  A provider at pt's job checked her ears but they were normal.  Denies fever, cough, sore throat, rhinorrhea.  Had shingles x 2 prior to moving to [place].  States had an episode last wk that felt similar to previous outbreaks.  Pt notes burning/discomfort on buttock and back.  Pt was unable to sit comfortably or wear a bra.  In the past pt tried gabapentin, however it made her extremely sleepy, still has med at home.  Pt denies any pustules or rash, only a discoloration of her skin in the areas.  Pt endorses increased stress last wk.  Pt's older kids are in [place] and [place].  Pt notes several people she grew up with in [place] died 2/2 COVID-19.  Pt checking on refill request for Adderall.    ROS: See pertinent positives and negatives per HPI.  Past Medical History:  Diagnosis Date  . Depression   . History of chicken pox     Past Surgical History:  Procedure Laterality Date  . APPENDECTOMY    . TUBAL LIGATION      No family history on file.  SOCIAL HX: works in health care.  Current Outpatient Medications:  .  amphetamine-dextroamphetamine (ADDERALL XR) 30 MG 24 hr capsule, Take 1 capsule (30 mg total) by mouth every morning., Disp: 30 capsule, Rfl: 0 .   amphetamine-dextroamphetamine (ADDERALL XR) 30 MG 24 hr capsule, Take 1 capsule (30 mg total) by mouth every morning., Disp: 30 capsule, Rfl: 0 .  amphetamine-dextroamphetamine (ADDERALL XR) 30 MG 24 hr capsule, Take 1 capsule (30 mg total) by mouth every morning., Disp: 30 capsule, Rfl: 0 .  Ascorbic Acid (VITAMIN C) 1000 MG tablet, Take 1,000 mg by mouth daily., Disp: , Rfl:  .  BIOTIN PO, Take by mouth., Disp: , Rfl:  .  cyanocobalamin 500 MCG tablet, Take 500 mcg by mouth daily., Disp: , Rfl:  .  fluconazole (DIFLUCAN) 150 MG tablet, Take 1 pill now.  Then repeat in 3 days if symptoms continue., Disp: 1 tablet, Rfl: 0 .  ibuprofen (ADVIL,MOTRIN) 600 MG tablet, Take 1 tablet (600 mg total) by mouth every 6 (six) hours as needed., Disp: 30 tablet, Rfl: 0  EXAM:  VITALS per patient if applicable:  RR between 12-20 bpm  GENERAL: alert, oriented, appears well and in no acute distress  HEENT: atraumatic, conjunctiva clear, no obvious abnormalities on inspection of external nose and ears  NECK: normal movements of the head and neck  LUNGS: on inspection no signs of respiratory distress, breathing rate appears normal, no obvious gross SOB, gasping or wheezing  CV: no obvious cyanosis  MS: moves all visible extremities without noticeable abnormality  SKIN: unable to see area of concern as pt was at work.  PSYCH/NEURO: pleasant and cooperative,  no obvious depression or anxiety, speech and thought processing grossly intact  ASSESSMENT AND PLAN:  Discussed the following assessment and plan:  Dysfunction of Eustachian tube, unspecified laterality -likely 2/2 allergies -discussed switching to a different OTC allergy med such as allegra, claritin, or xyzal. -will send in rx for flonase.  Herpes zoster without complication -resolving -discussed supportive care -pt has gabapentin.  Will use prn as makes her extremely lethargic.  Attention deficit hyperactivity disorder (ADHD),  predominantly inattentive type  - Plan: amphetamine-dextroamphetamine (ADDERALL XR) 30 MG 24 hr capsule, amphetamine-dextroamphetamine (ADDERALL XR) 30 MG 24 hr capsule   I discussed the assessment and treatment plan with the patient. The patient was provided an opportunity to ask questions and all were answered. The patient agreed with the plan and demonstrated an understanding of the instructions.   The patient was advised to call back or seek an in-person evaluation if the symptoms worsen or if the condition fails to improve as anticipated.   Deeann SaintShannon R Orean Giarratano, MD

## 2018-10-02 ENCOUNTER — Telehealth: Payer: Self-pay | Admitting: Family Medicine

## 2018-10-02 NOTE — Telephone Encounter (Signed)
Pt had a visit with dr Salomon Fick on 10/01/2018 and Rx was sent to pharmacy

## 2018-10-02 NOTE — Telephone Encounter (Signed)
Copied from CRM [id number]. Topic: Quick Communication - Rx Refill/Question >> Oct 02, 2018  9:25 AM Gwenlyn Fudge A wrote: Medication: amphetamine-dextroamphetamine (ADDERALL XR) 30 MG 24 hr capsule  Has the patient contacted their pharmacy? Yes.  Two refill requests went in to the pharmacy yesterday. The pt states the pharmacy will not fill both refills and is asking that a new refill request be sent in. The pt is requesting a call back stating this issue has been resolved so she can pick up her medication.  (Agent: If no, request that the patient contact the pharmacy for the refill.) (Agent: If yes, when and what did the pharmacy advise?)  Preferred Pharmacy (with phone number or street name):[id number] - [place], [place] - [address] AT [facility] [address] [place] [address] Phone: [phone] Fax: [phone] Open 24 hours    Agent: Please be advised that RX refills may take up to 3 business days. We ask that you follow-up with your pharmacy.

## 2018-10-02 NOTE — Telephone Encounter (Signed)
Pt called back about rx.  Per chart there were 3 rx's sent in however 2 of them say "Fill in 2 months" and the other says "Fill in 1 month" so pt has no rx to fill now. Please resend current rx.

## 2018-10-02 NOTE — Telephone Encounter (Signed)
Please advise 

## 2018-10-03 ENCOUNTER — Other Ambulatory Visit: Payer: Self-pay | Admitting: Family Medicine

## 2018-10-03 MED ORDER — AMPHETAMINE-DEXTROAMPHET ER 30 MG PO CP24: 30.0000 mg | ORAL_CAPSULE | ORAL | 0 refills | Status: DC

## 2018-10-03 NOTE — Telephone Encounter (Signed)
rx changed

## 2018-10-07 ENCOUNTER — Encounter: Payer: Self-pay | Admitting: Family Medicine

## 2018-10-16 ENCOUNTER — Telehealth: Payer: Self-pay | Admitting: *Deleted

## 2018-10-16 ENCOUNTER — Ambulatory Visit (HOSPITAL_COMMUNITY)
Admission: RE | Admit: 2018-10-16 | Discharge: 2018-10-16 | Disposition: A | Discharge status: Home / Self Care | Payer: BLUE CROSS/BLUE SHIELD | Source: Ambulatory Visit | Attending: Family | Admitting: Family

## 2018-10-16 ENCOUNTER — Encounter: Payer: Self-pay | Admitting: Family Medicine

## 2018-10-16 ENCOUNTER — Other Ambulatory Visit: Payer: Self-pay

## 2018-10-16 ENCOUNTER — Ambulatory Visit (INDEPENDENT_AMBULATORY_CARE_PROVIDER_SITE_OTHER): Payer: BLUE CROSS/BLUE SHIELD | Admitting: Family Medicine

## 2018-10-16 DIAGNOSIS — M79661 Pain in right lower leg: Secondary | ICD-10-CM

## 2018-10-16 DIAGNOSIS — R600 Localized edema: Secondary | ICD-10-CM

## 2018-10-16 DIAGNOSIS — M25561 Pain in right knee: Secondary | ICD-10-CM

## 2018-10-16 NOTE — Telephone Encounter (Signed)
Patient called after hours line on 10/15/2018. Patient reports she had a knee injury that never completely healed. Patients right leg, knee to foot is swollen and back of leg and knee is warm to touch. Patient had had a knee injury (x2) in November, and December, and has not really healed ever since. States had MRI done and was told it will take a while to heal, but states pain is getting worse. Using cold compresses/motrin/tylenol. Patients right ankle, calf, and knee are now red and warm to touch, tender, and tough. Patient was advised to go to UC patient declined.   Patient called requesting an order for [place] this morning. Informed patient she will have to be evaluated by PCP to get an [place] or she can go to ED or UC as advised. Patient declined and requested an appointment with PCP. Patient scheduled for 10 AM.

## 2018-10-16 NOTE — Progress Notes (Signed)
Virtual Visit via Video Note Able to see pt on webex, but had to call for audio.  Pt later turned video off.    I connected with Paige Wilkinson on 10/16/18 at 10:00 AM EDT by a video enabled telemedicine application and verified that I am speaking with the correct person using two identifiers.  Location patient: home Location provider:home office Persons participating in the virtual visit: patient, provider  I discussed the limitations of evaluation and management by telemedicine and the availability of in person appointments. The patient expressed understanding and agreed to proceed.   HPI: Pt states after helping a friend move her R knee started swelling.  Duration x 2 wks.  She used ice, knee brace.  Endorses decreased mobility/ difficulty with balance.  Feels like the back of her knee feels like "a rubber band being stretched" may have felt a pop when getting up from a chair.  Tried to make appt with Ortho.  Pt states her her R ankle is now swollen.  She notes increased warmth and pain in leg.  Denies CP, SOB.  Took Bayer ASA 81 mg.  Pt states her husband had to help her put on her shoes.  Pt did not want to go to ED b/c she didn't want to take her son with her.  ROS: See pertinent positives and negatives per HPI.  Past Medical History:  Diagnosis Date  . Depression   . History of chicken pox     Past Surgical History:  Procedure Laterality Date  . APPENDECTOMY    . TUBAL LIGATION      No family history on file.  SOCIAL HX: pt not working at her part time job at [employer] given COVID-19 pandemic.   Current Outpatient Medications:  .  amphetamine-dextroamphetamine (ADDERALL XR) 30 MG 24 hr capsule, Take 1 capsule (30 mg total) by mouth every morning., Disp: 30 capsule, Rfl: 0 .  amphetamine-dextroamphetamine (ADDERALL XR) 30 MG 24 hr capsule, Take 1 capsule (30 mg total) by mouth every morning., Disp: 30 capsule, Rfl: 0 .  amphetamine-dextroamphetamine (ADDERALL XR) 30 MG 24 hr  capsule, Take 1 capsule (30 mg total) by mouth every morning., Disp: 30 capsule, Rfl: 0 .  Ascorbic Acid (VITAMIN C) 1000 MG tablet, Take 1,000 mg by mouth daily., Disp: , Rfl:  .  BIOTIN PO, Take by mouth., Disp: , Rfl:  .  cyanocobalamin 500 MCG tablet, Take 500 mcg by mouth daily., Disp: , Rfl:  .  fluconazole (DIFLUCAN) 150 MG tablet, Take 1 pill now.  Then repeat in 3 days if symptoms continue., Disp: 1 tablet, Rfl: 0 .  fluticasone (FLONASE) 50 MCG/ACT nasal spray, Place 1 spray into both nostrils daily., Disp: 16 g, Rfl: 4 .  ibuprofen (ADVIL,MOTRIN) 600 MG tablet, Take 1 tablet (600 mg total) by mouth every 6 (six) hours as needed., Disp: 30 tablet, Rfl: 0  EXAM:  VITALS per patient if applicable:  GENERAL: alert, oriented, appears well and in no acute distress  HEENT: atraumatic, conjunctiva clear, no obvious abnormalities on inspection of external nose and ears  NECK: normal movements of the head and neck  LUNGS: on inspection no signs of respiratory distress, breathing rate appears normal, no obvious gross SOB, gasping or wheezing  CV: no obvious cyanosis  MS: moves all visible extremities without noticeable abnormality.  Pain in calf with squeezing calf and with dorsiflexion on foot.  PSYCH/NEURO: pleasant and cooperative, no obvious depression or anxiety, speech and thought processing grossly intact  ASSESSMENT AND PLAN:  Discussed the following assessment and plan:  Edema of right lower extremity  - Plan: VAS [place] LOWER EXTREMITY VENOUS (DVT)  Acute pain of right knee -ok to continue ice, NSAIDs, rest, elevation -discussed keeping appt with Ortho tomorrow. -given precautions  Right calf pain  -expressed concern for DVT given R calf pain and edema -will obtain LE u/s -given precautions for worsening symptoms, CP, SOB, fever, chills, tachycardia, etc. - Plan: VAS [place] LOWER EXTREMITY VENOUS (DVT)  F/u prn  I discussed the assessment and treatment plan with the  patient. The patient was provided an opportunity to ask questions and all were answered. The patient agreed with the plan and demonstrated an understanding of the instructions.   The patient was advised to call back or seek an in-person evaluation if the symptoms worsen or if the condition fails to improve as anticipated.  I provided 13 minutes of non-face-to-face time during this encounter.   Deeann Saint, MD

## 2018-10-16 NOTE — Telephone Encounter (Signed)
Noted  

## 2018-10-16 NOTE — Telephone Encounter (Signed)
Matters addressed in appointment.

## 2018-10-16 NOTE — Telephone Encounter (Signed)
Clinic RN took preliminary report on patient vascular ultrasound. Vascular US no DVT but patient does have a Baker's Cyst behind right knee. Patient was informed by [facility] Vascular staff and will await for Dr. Salomon Fick next steps for plan of care.

## 2018-10-17 DIAGNOSIS — M2241 Chondromalacia patellae, right knee: Secondary | ICD-10-CM | POA: Diagnosis not present

## 2019-02-07 DIAGNOSIS — T1501XA Foreign body in cornea, right eye, initial encounter: Secondary | ICD-10-CM | POA: Diagnosis not present

## 2019-03-06 ENCOUNTER — Other Ambulatory Visit: Payer: Self-pay | Admitting: Family Medicine

## 2019-03-06 DIAGNOSIS — F900 Attention-deficit hyperactivity disorder, predominantly inattentive type: Secondary | ICD-10-CM

## 2019-03-07 MED ORDER — AMPHETAMINE-DEXTROAMPHET ER 30 MG PO CP24: 30.0000 mg | ORAL_CAPSULE | ORAL | 0 refills | Status: DC

## 2019-03-07 NOTE — Telephone Encounter (Signed)
Pt LOV was on 10/16/2018 and last refill was on 10/03/2018, pt has an appointment on 03/12/2019, please advise

## 2019-03-12 ENCOUNTER — Other Ambulatory Visit: Payer: Self-pay

## 2019-03-12 ENCOUNTER — Other Ambulatory Visit (HOSPITAL_COMMUNITY)
Admission: RE | Admit: 2019-03-12 | Discharge: 2019-03-12 | Disposition: A | Discharge status: Home / Self Care | Payer: BLUE CROSS/BLUE SHIELD | Source: Ambulatory Visit | Attending: Family Medicine | Admitting: Family Medicine

## 2019-03-12 ENCOUNTER — Ambulatory Visit (INDEPENDENT_AMBULATORY_CARE_PROVIDER_SITE_OTHER): Payer: BLUE CROSS/BLUE SHIELD | Admitting: Family Medicine

## 2019-03-12 ENCOUNTER — Encounter: Payer: Self-pay | Admitting: Family Medicine

## 2019-03-12 VITALS — BP 98/68 | HR 88 | Temp 96.2°F | Wt 189.0 lb

## 2019-03-12 DIAGNOSIS — Z124 Encounter for screening for malignant neoplasm of cervix: Secondary | ICD-10-CM

## 2019-03-12 DIAGNOSIS — G4700 Insomnia, unspecified: Secondary | ICD-10-CM

## 2019-03-12 DIAGNOSIS — F900 Attention-deficit hyperactivity disorder, predominantly inattentive type: Secondary | ICD-10-CM | POA: Diagnosis not present

## 2019-03-12 DIAGNOSIS — R635 Abnormal weight gain: Secondary | ICD-10-CM | POA: Diagnosis not present

## 2019-03-12 DIAGNOSIS — Z0000 Encounter for general adult medical examination without abnormal findings: Secondary | ICD-10-CM | POA: Diagnosis not present

## 2019-03-12 DIAGNOSIS — M25561 Pain in right knee: Secondary | ICD-10-CM

## 2019-03-12 DIAGNOSIS — Z13220 Encounter for screening for lipoid disorders: Secondary | ICD-10-CM | POA: Diagnosis not present

## 2019-03-12 DIAGNOSIS — G8929 Other chronic pain: Secondary | ICD-10-CM

## 2019-03-12 LAB — CBC WITH DIFFERENTIAL/PLATELET
Basophils Absolute: 0 10*3/uL (ref 0.0–0.1)
Basophils Relative: 0.5 % (ref 0.0–3.0)
Eosinophils Absolute: 0.1 10*3/uL (ref 0.0–0.7)
Eosinophils Relative: 1 % (ref 0.0–5.0)
HCT: 39.1 % (ref 36.0–46.0)
Hemoglobin: 13 g/dL (ref 12.0–15.0)
Lymphocytes Relative: 38.5 % (ref 12.0–46.0)
Lymphs Abs: 2.7 10*3/uL (ref 0.7–4.0)
MCHC: 33.2 g/dL (ref 30.0–36.0)
MCV: 87.8 fl (ref 78.0–100.0)
Monocytes Absolute: 0.6 10*3/uL (ref 0.1–1.0)
Monocytes Relative: 8 % (ref 3.0–12.0)
Neutro Abs: 3.7 10*3/uL (ref 1.4–7.7)
Neutrophils Relative %: 52 % (ref 43.0–77.0)
Platelets: 324 10*3/uL (ref 150.0–400.0)
RBC: 4.45 Mil/uL (ref 3.87–5.11)
RDW: 12.7 % (ref 11.5–15.5)
WBC: 7.1 10*3/uL (ref 4.0–10.5)

## 2019-03-12 LAB — LIPID PANEL
Cholesterol: 141 mg/dL (ref 0–200)
HDL: 57.4 mg/dL (ref >39.00)
LDL Cholesterol: 71 mg/dL (ref 0–99)
NonHDL: 84
Total CHOL/HDL Ratio: 2
Triglycerides: 67 mg/dL (ref 0.0–149.0)
VLDL: 13.4 mg/dL (ref 0.0–40.0)

## 2019-03-12 LAB — BASIC METABOLIC PANEL
BUN: 16 mg/dL (ref 6–23)
CO2: 27 mEq/L (ref 19–32)
Calcium: 8.9 mg/dL (ref 8.4–10.5)
Chloride: 104 mEq/L (ref 96–112)
Creatinine, Ser: 0.87 mg/dL (ref 0.40–1.20)
GFR: 85.72 mL/min (ref >60.00)
Glucose, Bld: 82 mg/dL (ref 70–99)
Potassium: 4.4 mEq/L (ref 3.5–5.1)
Sodium: 138 mEq/L (ref 135–145)

## 2019-03-12 LAB — T4, FREE: Free T4: 0.99 ng/dL (ref 0.60–1.60)

## 2019-03-12 LAB — TSH: TSH: 0.95 u[IU]/mL (ref 0.35–4.50)

## 2019-03-12 LAB — HEMOGLOBIN A1C: Hgb A1c MFr Bld: 5.6 % (ref 4.6–6.5)

## 2019-03-12 MED ORDER — AMPHETAMINE-DEXTROAMPHET ER 25 MG PO CP24: 25.0000 mg | ORAL_CAPSULE | ORAL | 0 refills | Status: DC

## 2019-03-12 NOTE — Addendum Note (Signed)
Addended by: Wyvonne Lenz on: 03/12/2019 02:37 PM   Modules accepted: Orders

## 2019-03-12 NOTE — Patient Instructions (Addendum)
Preventive Care 40-43 Years Old, Female Preventive care refers to visits with your health care provider and lifestyle choices that can promote health and wellness. This includes:  A yearly physical exam. This may also be called an annual well check.  Regular dental visits and eye exams.  Immunizations.  Screening for certain conditions.  Healthy lifestyle choices, such as eating a healthy diet, getting regular exercise, not using drugs or products that contain nicotine and tobacco, and limiting alcohol use. What can I expect for my preventive care visit? Physical exam Your health care provider will check your:  Height and weight. This may be used to calculate body mass index (BMI), which tells if you are at a healthy weight.  Heart rate and blood pressure.  Skin for abnormal spots. Counseling Your health care provider may ask you questions about your:  Alcohol, tobacco, and drug use.  Emotional well-being.  Home and relationship well-being.  Sexual activity.  Eating habits.  Work and work environment.  Method of birth control.  Menstrual cycle.  Pregnancy history. What immunizations do I need?  Influenza (flu) vaccine  This is recommended every year. Tetanus, diphtheria, and pertussis (Tdap) vaccine  You may need a Td booster every 10 years. Varicella (chickenpox) vaccine  You may need this if you have not been vaccinated. Zoster (shingles) vaccine  You may need this after age 60. Measles, mumps, and rubella (MMR) vaccine  You may need at least one dose of MMR if you were born in 1957 or later. You may also need a second dose. Pneumococcal conjugate (PCV13) vaccine  You may need this if you have certain conditions and were not previously vaccinated. Pneumococcal polysaccharide (PPSV23) vaccine  You may need one or two doses if you smoke cigarettes or if you have certain conditions. Meningococcal conjugate (MenACWY) vaccine  You may need this if you  have certain conditions. Hepatitis A vaccine  You may need this if you have certain conditions or if you travel or work in places where you may be exposed to hepatitis A. Hepatitis B vaccine  You may need this if you have certain conditions or if you travel or work in places where you may be exposed to hepatitis B. Haemophilus influenzae type b (Hib) vaccine  You may need this if you have certain conditions. Human papillomavirus (HPV) vaccine  If recommended by your health care provider, you may need three doses over 6 months. You may receive vaccines as individual doses or as more than one vaccine together in one shot (combination vaccines). Talk with your health care provider about the risks and benefits of combination vaccines. What tests do I need? Blood tests  Lipid and cholesterol levels. These may be checked every 5 years, or more frequently if you are over 50 years old.  Hepatitis C test.  Hepatitis B test. Screening  Lung cancer screening. You may have this screening every year starting at age 55 if you have a 30-pack-year history of smoking and currently smoke or have quit within the past 15 years.  Colorectal cancer screening. All adults should have this screening starting at age 50 and continuing until age 75. Your health care provider may recommend screening at age 45 if you are at increased risk. You will have tests every 1-10 years, depending on your results and the type of screening test.  Diabetes screening. This is done by checking your blood sugar (glucose) after you have not eaten for a while (fasting). You may have this   done every 1-3 years.  Mammogram. This may be done every 1-2 years. Talk with your health care provider about when you should start having regular mammograms. This may depend on whether you have a family history of breast cancer.  BRCA-related cancer screening. This may be done if you have a family history of breast, ovarian, tubal, or peritoneal  cancers.  Pelvic exam and Pap test. This may be done every 3 years starting at age 42. Starting at age 60, this may be done every 5 years if you have a Pap test in combination with an HPV test. Other tests  Sexually transmitted disease (STD) testing.  Bone density scan. This is done to screen for osteoporosis. You may have this scan if you are at high risk for osteoporosis. Follow these instructions at home: Eating and drinking  Eat a diet that includes fresh fruits and vegetables, whole grains, lean protein, and low-fat dairy.  Take vitamin and mineral supplements as recommended by your health care provider.  Do not drink alcohol if: ? Your health care provider tells you not to drink. ? You are pregnant, may be pregnant, or are planning to become pregnant.  If you drink alcohol: ? Limit how much you have to 0-1 drink a day. ? Be aware of how much alcohol is in your drink. In the [place] one drink equals one 12 oz bottle of beer (355 mL), one 5 oz glass of wine (148 mL), or one 1 oz glass of hard liquor (44 mL). Lifestyle  Take daily care of your teeth and gums.  Stay active. Exercise for at least 30 minutes on 5 or more days each week.  Do not use any products that contain nicotine or tobacco, such as cigarettes, e-cigarettes, and chewing tobacco. If you need help quitting, ask your health care provider.  If you are sexually active, practice safe sex. Use a condom or other form of birth control (contraception) in order to prevent pregnancy and STIs (sexually transmitted infections).  If told by your health care provider, take low-dose aspirin daily starting at age 92. What's next?  Visit your health care provider once a year for a well check visit.  Ask your health care provider how often you should have your eyes and teeth checked.  Stay up to date on all vaccines. This information is not intended to replace advice given to you by your health care provider. Make sure you  discuss any questions you have with your health care provider. Document Released: 07/09/2015 Document Revised: 02/21/2018 Document Reviewed: 02/21/2018 Elsevier Patient Education  2020 [employer] Screening for Women A cancer screening is a test or exam that checks for cancer. Your health care provider will recommend specific cancer screenings based on your age, personal history, and family history of cancer. Work with your health care provider to create a cancer screening schedule that protects your health. Why is cancer screening done? Cancer screening is done to look for cancer in the very early stages, before it spreads and becomes harder to treat and before you would start to notice symptoms. Finding cancer early improves the chances of successful treatment. It may save your life. Who should be screened for cancer? All women should be screened for certain cancers, including breast cancer, cervical cancer, and skin cancer. Your health care provider may recommend screenings for other types of cancer if:  You had cancer before.  You have a family member with cancer.  You have abnormal genes that  could increase the risk of cancer.  You have risk factors for certain cancers, such as smoking. When you should be screened for cancer depends on:  Your age.  Your medical history and your family's medical history.  Certain lifestyle factors, such as smoking.  Environmental exposure, such as to asbestos. What are some common cancer screenings? Breast cancer Breast cancer screening is done with a test that takes images of breast tissue (mammogram). Here are some screening guidelines:  When you are age 56-44, you will be given the choice to start having mammograms.  When you are age 38-54, you should have a mammogram every year.  You may start having mammograms before age 70 if you have risk factors for breast cancer, such as having an immediate family member with breast cancer.   When you are age 52 or older, you should have a mammogram every 1-2 years for as long as you are in good health and have a life expectancy of 10 years or more.  It is important to know what your breasts look and feel like so you can report any changes to your health care provider.  Cervical cancer Cervical cancer screening is done with a Pap test. This testchecks for abnormalities, including the virus that causes cervical cancer (human papillomavirus, or HPV). To perform the test, a health care provider takes a swab of cervical cells during a pelvic exam. Screening for cervical cancer with a Pap test should start at age 19. Here are some screening guidelines:  When you are age 53-29, you should have a Pap test every 3 years.  When you are age 86-65, you should have a Pap test and HPV test every 5 years or have a Pap test every 3 years.  You may be screened for cervical cancer more often if you have risk factors for cervical cancer.  If your Pap tests are abnormal, you may have an HPV test.  If you have had the HPV vaccine, you will still be screened for cervical cancer and follow normal screening recommendations. You do not need to be screened for cervical cancer if any of the following apply to you:  You are older than age 4 and you have not had a serious cervical precancer or cancer in the last 20 years.  Your cervix and uterus have been removed and you have never had cervical cancer or precancerous cells. Endometrial cancer There is no standard screening test for endometrial cancer, but the cancer can be detected with:  A test of a sample of tissue taken from the lining of the uterus (endometrial tissue biopsy).  A vaginal ultrasound.  Pap tests. If you are at increased risk for endometrial cancer, you may need to have these tests more often than normal. You are at increased risk if:  You have a family history of ovarian, uterine, or colon cancer.  You are taking tamoxifen, a  drug that is used to treat breast cancer.  You have certain types of colon cancer. If you have reached menopause, it is especially important to talk with your health care provider about any vaginal bleeding or spotting. Screening for endometrial cancer is not recommended for women who do not have symptoms of the cancer, such as vaginal bleeding. Colorectal cancer  All adults should have screening for colorectal cancer starting at age 17 and continuing until age 44. Your health care provider may recommend screening at age 68. You will have tests every 1-10 years, depending on your results and  the type of screening test. If you have a family history of colon or rectal cancer or other risk factors, you may need to start having screenings earlier. Talk with your health care provider about which screening test is right for you and how often you should be screened. Colorectal cancer screening looks for cancer or for growths called polyps that often form before cancer starts. Tests to look for cancer or polyps include:  Colonoscopy or flexible sigmoidoscopy. For these procedures, a flexible tube with a small camera is inserted into the rectum.  CT colonography. This test uses X-rays and a contrast dye to check the colon for polyps. If a polyp is found, you may need to have a colonoscopy so the polyp can be located and removed. Tests to look for cancer in the stool (feces) include:  Guaiac-based fecal occult blood test (FOBT). This test detects blood in stool. It can be done at home with a kit.  Fecal immunochemical test (FIT). This test detects blood in stool. For this test, you will need to collect stool samples at home.  Stool DNA test. This test looks for blood in stool and any changes in DNA that can lead to colon cancer. For this test, you will need to collect a stool sample at home and send it to a lab.  Skin cancer Skin cancer screening is done by checking the skin for unusual moles or spots and  any changes in existing moles. Your health care provider should check your skin for signs of skin cancer at every physical exam. You should check your skin every month and tell your health care provider right away if anything looks unusual. Women with a higher-than-normal risk for skin cancer may want to see a skin specialist (dermatologist) for an annual body check. Lung cancer Lung cancer screening is done with a CT scan that looks for abnormal cells in the lungs. Discuss lung cancer screening with your health care provider if you are 75-63 years old and if any of the following apply to you:  You currently smoke.  You used to smoke heavily.  You have a smoking history of 1 pack a day for 30 years or 2 packs a day for 15 years.  You have quit smoking within the past 15 years. If you smoke heavily or if you used to smoke, you may need to be screened every year. Where to find more information  [place]: SkinPromotion.no  Centers for Disease Control and Prevention: http://knight-sullivan.biz/  Department of Health and Human Services: BankingDetective.si Contact a health care provider if:  You have concerns about any signs or symptoms of cancer, such as: ? Moles that have an unusual shape or color. ? Changes in existing moles. ? A sore on your skin that does not heal. ? Blood in your stool. ? Fatigue that does not go away. ? Frequent pain or cramping in your abdomen. ? Coughing, or coughing up blood. ? Losing weight without trying. ? Lumps or other changes in your breasts. ? Vaginal bleeding, spotting, or changes in your periods. Summary  Be aware of and watch for signs and symptoms of cancer, especially symptoms of breast cancer, cervical cancer, endometrial cancer, colorectal cancer, skin cancer, and lung cancer.  Early detection of cancer with  cancer screening may save your life.  Talk with your health care provider about your specific cancer risks.  Work together with your health care provider to create a cancer screening plan that is right for  you. This information is not intended to replace advice given to you by your health care provider. Make sure you discuss any questions you have with your health care provider. Document Released: 03/09/2016 Document Revised: 10/02/2018 Document Reviewed: 03/09/2016 Elsevier Patient Education  [place].  Insomnia Insomnia is a sleep disorder that makes it difficult to fall asleep or stay asleep. Insomnia can cause fatigue, low energy, difficulty concentrating, mood swings, and poor performance at work or school. There are three different ways to classify insomnia:  Difficulty falling asleep.  Difficulty staying asleep.  Waking up too early in the morning. Any type of insomnia can be long-term (chronic) or short-term (acute). Both are common. Short-term insomnia usually lasts for three months or less. Chronic insomnia occurs at least three times a week for longer than three months. What are the causes? Insomnia may be caused by another condition, situation, or substance, such as:  Anxiety.  Certain medicines.  Gastroesophageal reflux disease (GERD) or other gastrointestinal conditions.  Asthma or other breathing conditions.  Restless legs syndrome, sleep apnea, or other sleep disorders.  Chronic pain.  Menopause.  Stroke.  Abuse of alcohol, tobacco, or illegal drugs.  Mental health conditions, such as depression.  Caffeine.  Neurological disorders, such as Alzheimer's disease.  An overactive thyroid (hyperthyroidism). Sometimes, the cause of insomnia may not be known. What increases the risk? Risk factors for insomnia include:  Gender. Women are affected more often than men.  Age. Insomnia is more common as you get older.  Stress.  Lack of exercise.   Irregular work schedule or working night shifts.  Traveling between different time zones.  Certain medical and mental health conditions. What are the signs or symptoms? If you have insomnia, the main symptom is having trouble falling asleep or having trouble staying asleep. This may lead to other symptoms, such as:  Feeling fatigued or having low energy.  Feeling nervous about going to sleep.  Not feeling rested in the morning.  Having trouble concentrating.  Feeling irritable, anxious, or depressed. How is this diagnosed? This condition may be diagnosed based on:  Your symptoms and medical history. Your health care provider may ask about: ? Your sleep habits. ? Any medical conditions you have. ? Your mental health.  A physical exam. How is this treated? Treatment for insomnia depends on the cause. Treatment may focus on treating an underlying condition that is causing insomnia. Treatment may also include:  Medicines to help you sleep.  Counseling or therapy.  Lifestyle adjustments to help you sleep better. Follow these instructions at home: Eating and drinking   Limit or avoid alcohol, caffeinated beverages, and cigarettes, especially close to bedtime. These can disrupt your sleep.  Do not eat a large meal or eat spicy foods right before bedtime. This can lead to digestive discomfort that can make it hard for you to sleep. Sleep habits   Keep a sleep diary to help you and your health care provider figure out what could be causing your insomnia. Write down: ? When you sleep. ? When you wake up during the night. ? How well you sleep. ? How rested you feel the next day. ? Any side effects of medicines you are taking. ? What you eat and drink.  Make your bedroom a dark, comfortable place where it is easy to fall asleep. ? Put up shades or blackout curtains to block light from outside. ? Use a white noise machine to block noise. ? Keep the temperature cool.  Limit screen use before bedtime. This includes: ? Watching TV. ? Using your smartphone, tablet, or computer.  Stick to a routine that includes going to bed and waking up at the same times every day and night. This can help you fall asleep faster. Consider making a quiet activity, such as reading, part of your nighttime routine.  Try to avoid taking naps during the day so that you sleep better at night.  Get out of bed if you are still awake after 15 minutes of trying to sleep. Keep the lights down, but try reading or doing a quiet activity. When you feel sleepy, go back to bed. General instructions  Take over-the-counter and prescription medicines only as told by your health care provider.  Exercise regularly, as told by your health care provider. Avoid exercise starting several hours before bedtime.  Use relaxation techniques to manage stress. Ask your health care provider to suggest some techniques that may work well for you. These may include: ? Breathing exercises. ? Routines to release muscle tension. ? Visualizing peaceful scenes.  Make sure that you drive carefully. Avoid driving if you feel very sleepy.  Keep all follow-up visits as told by your health care provider. This is important. Contact a health care provider if:  You are tired throughout the day.  You have trouble in your daily routine due to sleepiness.  You continue to have sleep problems, or your sleep problems get worse. Get help right away if:  You have serious thoughts about hurting yourself or someone else. If you ever feel like you may hurt yourself or others, or have thoughts about taking your own life, get help right away. You can go to your nearest emergency department or call:  Your local emergency services (911 in the [place]  A suicide crisis helpline, such as the [place] at 763-406-8373. This is open 24 hours a day. Summary  Insomnia is a sleep disorder that makes it  difficult to fall asleep or stay asleep.  Insomnia can be long-term (chronic) or short-term (acute).  Treatment for insomnia depends on the cause. Treatment may focus on treating an underlying condition that is causing insomnia.  Keep a sleep diary to help you and your health care provider figure out what could be causing your insomnia. This information is not intended to replace advice given to you by your health care provider. Make sure you discuss any questions you have with your health care provider. Document Released: 06/09/2000 Document Revised: 05/25/2017 Document Reviewed: 03/22/2017 Elsevier Patient Education  2020 [employer].  Exercising to Lose Weight Exercise is structured, repetitive physical activity to improve fitness and health. Getting regular exercise is important for everyone. It is especially important if you are overweight. Being overweight increases your risk of heart disease, stroke, diabetes, high blood pressure, and several types of cancer. Reducing your calorie intake and exercising can help you lose weight. Exercise is usually categorized as moderate or vigorous intensity. To lose weight, most people need to do a certain amount of moderate-intensity or vigorous-intensity exercise each week. Moderate-intensity exercise  Moderate-intensity exercise is any activity that gets you moving enough to burn at least three times more energy (calories) than if you were sitting. Examples of moderate exercise include:  Walking a mile in 15 minutes.  Doing light yard work.  Biking at an easy pace. Most people should get at least 150 minutes (2 hours and 30 minutes) a week of moderate-intensity exercise to maintain their body weight.  Vigorous-intensity exercise Vigorous-intensity exercise is any activity that gets you moving enough to burn at least six times more calories than if you were sitting. When you exercise at this intensity, you should be working hard enough that you  are not able to carry on a conversation. Examples of vigorous exercise include:  Running.  Playing a team sport, such as football, basketball, and soccer.  Jumping rope. Most people should get at least 75 minutes (1 hour and 15 minutes) a week of vigorous-intensity exercise to maintain their body weight. How can exercise affect me? When you exercise enough to burn more calories than you eat, you lose weight. Exercise also reduces body fat and builds muscle. The more muscle you have, the more calories you burn. Exercise also:  Improves mood.  Reduces stress and tension.  Improves your overall fitness, flexibility, and endurance.  Increases bone strength. The amount of exercise you need to lose weight depends on:  Your age.  The type of exercise.  Any health conditions you have.  Your overall physical ability. Talk to your health care provider about how much exercise you need and what types of activities are safe for you. What actions can I take to lose weight? Nutrition   Make changes to your diet as told by your health care provider or diet and nutrition specialist (dietitian). This may include: ? Eating fewer calories. ? Eating more protein. ? Eating less unhealthy fats. ? Eating a diet that includes fresh fruits and vegetables, whole grains, low-fat dairy products, and lean protein. ? Avoiding foods with added fat, salt, and sugar.  Drink plenty of water while you exercise to prevent dehydration or heat stroke. Activity  Choose an activity that you enjoy and set realistic goals. Your health care provider can help you make an exercise plan that works for you.  Exercise at a moderate or vigorous intensity most days of the week. ? The intensity of exercise may vary from person to person. You can tell how intense a workout is for you by paying attention to your breathing and heartbeat. Most people will notice their breathing and heartbeat get faster with more intense  exercise.  Do resistance training twice each week, such as: ? Push-ups. ? Sit-ups. ? Lifting weights. ? Using resistance bands.  Getting short amounts of exercise can be just as helpful as long structured periods of exercise. If you have trouble finding time to exercise, try to include exercise in your daily routine. ? Get up, stretch, and walk around every 30 minutes throughout the day. ? Go for a walk during your lunch break. ? Park your car farther away from your destination. ? If you take public transportation, get off one stop early and walk the rest of the way. ? Make phone calls while standing up and walking around. ? Take the stairs instead of elevators or escalators.  Wear comfortable clothes and shoes with good support.  Do not exercise so much that you hurt yourself, feel dizzy, or get very short of breath. Where to find more information  U.S. Department of Health and Human Services: BondedCompany.at  Centers for Disease Control and Prevention (CDC): http://www.wolf.info/ Contact a health care provider:  Before starting a new exercise program.  If you have questions or concerns about your weight.  If you have a medical problem that keeps you from exercising. Get help right away if you have any of the following while exercising:  Injury.  Dizziness.  Difficulty breathing or shortness of  breath that does not go away when you stop exercising.  Chest pain.  Rapid heartbeat. Summary  Being overweight increases your risk of heart disease, stroke, diabetes, high blood pressure, and several types of cancer.  Losing weight happens when you burn more calories than you eat.  Reducing the amount of calories you eat in addition to getting regular moderate or vigorous exercise each week helps you lose weight. This information is not intended to replace advice given to you by your health care provider. Make sure you discuss any questions you have with your health care provider.  Document Released: 07/15/2010 Document Revised: 06/25/2017 Document Reviewed: 06/25/2017 Elsevier Patient Education  2020 [employer].

## 2019-03-12 NOTE — Progress Notes (Signed)
Subjective:     Paige Wilkinson is a 43 y.o. female and is here for a comprehensive physical exam. The patient reports problems - R knee pain, recent L eye injury, skin irritation, insomnia.  Pt notes continued R knee pain and edema anteriorly and posteriorly.  Seen by ortho in past, had steroid injection, but it did not last long.  Pt icing knee daily.  Pt reports having to go to the Ophthalmologist a few wks ago for eye pain/irritiaton.  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  While outside during the landscapers were cutting the lawn, a piece of glass flew into her eye.  Pt had the glass and some grass removed from the eye.  Pt endorses feeling better now.  Pt also with skin irritation under L breast from increased sweating since working outside.  Pt also with insomnia.  Has difficulty falling asleep most nights.  Pt may take Zzzquil to help.  Melatonin has not helped in the past.  Pt takes Adderall XR 30 mg daily around 7:30 am.  Pt with weight gain.  Trying to eat better and not so late at night.  Would like lose 20 lbs.   LMP: ended 9/13. Mammogram scheduled. Social History   Socioeconomic History  . Marital status: Married    Spouse name: Not on file  . Number of children: Not on file  . Years of education: Not on file  . Highest education level: Not on file  Occupational History  . Not on file  Social Needs  . Financial resource strain: Not on file  . Food insecurity    Worry: Not on file    Inability: Not on file  . Transportation needs    Medical: Not on file    Non-medical: Not on file  Tobacco Use  . Smoking status: Former Research scientist (life sciences)  . Smokeless tobacco: Never Used  Substance and Sexual Activity  . Alcohol use: Yes    Frequency: Never    Comment: rarely  . Drug use: No  . Sexual activity: Not on file  Lifestyle  . Physical activity    Days per week: Not on file    Minutes per session: Not on file  . Stress: Not on file  Relationships  . Social Clinical research associate on phone: Not on file    Gets together: Not on file    Attends religious service: Not on file    Active member of club or organization: Not on file    Attends meetings of clubs or organizations: Not on file    Relationship status: Not on file  . Intimate partner violence    Fear of current or ex partner: Not on file    Emotionally abused: Not on file    Physically abused: Not on file    Forced sexual activity: Not on file  Other Topics Concern  . Not on file  Social History Narrative  . Not on file   Health Maintenance  Topic Date Due  . HIV Screening  07/17/1990  . PAP SMEAR-Modifier  07/16/2012  . INFLUENZA VACCINE  01/25/2019  . TETANUS/TDAP  11/06/2022    The following portions of the patient's history were reviewed and updated as appropriate: allergies, current medications, past family history, past medical history, past social history, past surgical history and problem list.  Review of Systems Pertinent items noted in HPI and remainder of comprehensive ROS otherwise negative.   Objective:    BP 98/68 (BP Location: Right  Arm, Patient Position: Sitting, Cuff Size: Normal)   Pulse 88   Temp (!) 96.2 F (35.7 C)   Wt 189 lb (85.7 kg)   LMP 02/26/2019 (Exact Date)   SpO2 98%   BMI 31.94 kg/m  General appearance: alert, cooperative and no distress Head: Normocephalic, without obvious abnormality, atraumatic Eyes: conjunctivae/corneas clear. PERRL, EOM's intact. Fundi benign. Ears: normal TM's and external ear canals both ears Nose: Nares normal. Septum midline. Mucosa normal. No drainage or sinus tenderness. Throat: lips, mucosa, and tongue normal; teeth and gums normal Neck: no adenopathy, no carotid bruit, no JVD, supple, symmetrical, trachea midline and thyroid not enlarged, symmetric, no tenderness/mass/nodules Lungs: clear to auscultation bilaterally Heart: regular rate and rhythm, S1, S2 normal, no murmur, click, rub or gallop Abdomen: soft, non-tender;  bowel sounds normal; no masses,  no organomegaly Pelvic: cervix normal in appearance, external genitalia normal, no adnexal masses or tenderness, no cervical motion tenderness, rectovaginal septum normal, uterus normal size, shape, and consistency and vagina normal without discharge Extremities: extremities normal, atraumatic, no cyanosis or edema.  Right knee without edema.  Crepitus noted. Pulses: 2+ and symmetric Skin: Skin color, texture, turgor normal. No rashes or lesions Lymph nodes: Cervical, supraclavicular, and axillary nodes normal. Neurologic: Alert and oriented X 3, normal strength and tone. Normal symmetric reflexes. Normal coordination and gait    Assessment:    Healthy female exam.      Plan:     Anticipatory guidance given including wearing seatbelts, smoke detectors in the home, increasing physical activity, increasing p.o. intake of water and vegetables. -will obtain labs -pap done this visit. -mammogram scheduled -will obtain influenza vaccine at work. -given handout -next CPE in 1 yr See After Visit Summary for Counseling Recommendations    Attention deficit hyperactivity disorder (ADHD), predominantly inattentive type  -1 month refill for Adderall XR 30 mg sent 9/11. -will send in new rx for lower dose to see if insomnia improves.  Pt to pick up rx when out of 30 mg tabs. - Plan: amphetamine-dextroamphetamine (ADDERALL XR) 25 MG 24 hr capsule  Cervical cancer screening -pap obtained this visit -Offered STI testing.  Patient declined.  Weight gain -Discussed lifestyle modifications -Given handout  - Plan: TSH, T4, Free, Hemoglobin A1c  Insomnia, unspecified type -We will adjust dose of Adderall to see if insomnia improves -Given handout.  Discussed sleep hygiene.  - Plan: TSH, T4, Free  Chronic right knee pain -Continue follow-up with Ortho -Discussed obtaining second opinion -Continue ice and supportive care  Screening for cholesterol level  -  Plan: Lipid panel  F/u in 3 months for ADHD  Abbe AmsterdamShannon Jillana Selph, MD

## 2019-03-17 LAB — CYTOLOGY - PAP
Diagnosis: NEGATIVE
Molecular Disclaimer: NEGATIVE
Trichomonas: NEGATIVE

## 2019-05-31 ENCOUNTER — Emergency Department (HOSPITAL_COMMUNITY)
Admission: EM | Admit: 2019-05-31 | Discharge: 2019-05-31 | Disposition: A | Discharge status: Home / Self Care | Payer: 59 | Attending: Emergency Medicine | Admitting: Emergency Medicine

## 2019-05-31 ENCOUNTER — Emergency Department (HOSPITAL_COMMUNITY): Payer: 59

## 2019-05-31 ENCOUNTER — Other Ambulatory Visit: Payer: Self-pay

## 2019-05-31 ENCOUNTER — Encounter (HOSPITAL_COMMUNITY): Payer: Self-pay | Admitting: Emergency Medicine

## 2019-05-31 DIAGNOSIS — M25561 Pain in right knee: Secondary | ICD-10-CM | POA: Insufficient documentation

## 2019-05-31 DIAGNOSIS — Z79899 Other long term (current) drug therapy: Secondary | ICD-10-CM | POA: Insufficient documentation

## 2019-05-31 DIAGNOSIS — Z87891 Personal history of nicotine dependence: Secondary | ICD-10-CM | POA: Insufficient documentation

## 2019-05-31 DIAGNOSIS — G8929 Other chronic pain: Secondary | ICD-10-CM | POA: Diagnosis not present

## 2019-05-31 MED ORDER — NAPROXEN 500 MG PO TABS: 500.0000 mg | ORAL_TABLET | Freq: Two times a day (BID) | ORAL | 0 refills | Status: DC

## 2019-05-31 MED ORDER — ACETAMINOPHEN 500 MG PO TABS: 500.0000 mg | ORAL_TABLET | Freq: Four times a day (QID) | ORAL | 0 refills | Status: DC | PRN

## 2019-05-31 NOTE — Progress Notes (Signed)
Orthopedic Tech Progress Note Patient Details:  Paige Wilkinson 07-15-1975 [id number]  Ortho Devices Type of Ortho Device: Crutches, Knee Immobilizer Ortho Device/Splint Location: RLE Ortho Device/Splint Interventions: Application, Ordered   Post Interventions Patient Tolerated: Ambulated well Instructions Provided: Poper ambulation with device, Care of device, Adjustment of device   Janit Pagan 05/31/2019, 1:17 PM

## 2019-05-31 NOTE — ED Triage Notes (Addendum)
C/o chronic R knee pain but pain worse after getting leg caught on wire on steps yesterday.  States she feels R knee popping when bending it.  Pain better after icing.  Taking ibuprofen.

## 2019-05-31 NOTE — Discharge Instructions (Signed)
1. Medications: Take naproxen twice daily with meals as needed for pain.  Do not take ibuprofen, Advil, Aleve, or Motrin while taking this medication.  However you can additionally take [phone] mg of Tylenol every 6 hours as needed for pain. Do not exceed 4000 mg of Tylenol daily.   2. Treatment: rest, ice, elevate and use knee immobilizer and crutches, drink plenty of fluids, gentle stretching 3. Follow Up: Please followup with orthopedics as directed or your PCP in 1 week if no improvement for discussion of your diagnoses and further evaluation after today's visit; if you do not have a primary care doctor use the resource guide provided to find one; Please return to the ER for worsening symptoms or other concerns such as worsening swelling, redness of the skin, fevers, loss of pulses, or loss of feeling

## 2019-05-31 NOTE — ED Provider Notes (Signed)
[facility] EMERGENCY DEPARTMENT Provider Note   CSN: [id number] Arrival date & time: 05/31/19  1033     History   Chief Complaint Chief Complaint  Patient presents with  . Knee Pain    HPI Paige Wilkinson is a 43 y.o. female presents today for evaluation of acute onset, progressively worsening of chronic right knee pain.  She reports that she has been experiencing right knee pain intermittently for years for which she gets steroid injections at [facility].  Yesterday while she was walking up steps outside her home she tripped on a wire.  She denies head injury or loss of consciousness.  She did not directly land on the right knee but felt a "pop" while she tripped and since then has had significant worsening of her right knee pain.  The pain is sharp, radiates up and down the extremity.  At times she experiences numbness and tingling into her toes.  This is not unusual for her when her knee pain flares up she states.  The pain improves and is bearable when the knee is extended, worsens with any flexion and weightbearing.  She feels as though the knee will catch when she flexes it and at times feels unstable with weightbearing.  Pain also improved with application of ice.  She has taken a muscle relaxer, ibuprofen and Tylenol without significant relief of symptoms.  Denies fevers.     The history is provided by the patient.    Past Medical History:  Diagnosis Date  . Depression   . History of chicken pox     Patient Active Problem List   Diagnosis Date Noted  . ADD (attention deficit disorder) 10/18/2013  . UTI'S, HX OF 06/15/2010  . CHICKENPOX, HX OF 06/15/2010    Past Surgical History:  Procedure Laterality Date  . APPENDECTOMY    . TUBAL LIGATION       OB History   No obstetric history on file.      Home Medications    Prior to Admission medications   Medication Sig Start Date End Date Taking? Authorizing Provider  acetaminophen  (TYLENOL) 500 MG tablet Take 1 tablet (500 mg total) by mouth every 6 (six) hours as needed. 05/31/19   Monetta Lick A, PA-C  amphetamine-dextroamphetamine (ADDERALL XR) 25 MG 24 hr capsule Take 1 capsule by mouth every morning. 03/12/19   Deeann Saint, MD  amphetamine-dextroamphetamine (ADDERALL XR) 30 MG 24 hr capsule Take 1 capsule (30 mg total) by mouth every morning. 09/30/18   Deeann Saint, MD  amphetamine-dextroamphetamine (ADDERALL XR) 30 MG 24 hr capsule Take 1 capsule (30 mg total) by mouth every morning. 10/03/18   Deeann Saint, MD  amphetamine-dextroamphetamine (ADDERALL XR) 30 MG 24 hr capsule Take 1 capsule (30 mg total) by mouth every morning. 03/07/19   Deeann Saint, MD  Ascorbic Acid (VITAMIN C) 1000 MG tablet Take 1,000 mg by mouth daily.    [provider]  BIOTIN PO Take by mouth.    [provider]  cyanocobalamin 500 MCG tablet Take 500 mcg by mouth daily.    [provider]  fluconazole (DIFLUCAN) 150 MG tablet Take 1 pill now.  Then repeat in 3 days if symptoms continue. 08/21/18   Deeann Saint, MD  fluticasone (FLONASE) 50 MCG/ACT nasal spray Place 1 spray into both nostrils daily. 10/01/18   Deeann Saint, MD  naproxen (NAPROSYN) 500 MG tablet Take 1 tablet (500 mg total)  by mouth 2 (two) times daily with a meal. 05/31/19   Renita Papa, PA-C    Family History No family history on file.  Social History Social History   Tobacco Use  . Smoking status: Former Research scientist (life sciences)  . Smokeless tobacco: Never Used  Substance Use Topics  . Alcohol use: Yes    Frequency: Never    Comment: rarely  . Drug use: No     Allergies   Patient has no known allergies.   Review of Systems Review of Systems  Constitutional: Negative for chills and fever.  Musculoskeletal: Positive for arthralgias.  Neurological: Positive for numbness. Negative for weakness.     Physical Exam Updated Vital Signs BP 131/75 (BP Location: Left Arm)   Pulse 77    Temp 98 F (36.7 C) (Oral)   Resp 16   LMP 05/24/2019   SpO2 100%   Physical Exam Vitals signs and nursing note reviewed.  Constitutional:      General: She is not in acute distress.    Appearance: She is well-developed.  HENT:     Head: Normocephalic and atraumatic.  Eyes:     General:        Right eye: No discharge.        Left eye: No discharge.     Conjunctiva/sclera: Conjunctivae normal.  Neck:     Vascular: No JVD.     Trachea: No tracheal deviation.  Cardiovascular:     Rate and Rhythm: Normal rate.     Pulses: Normal pulses.     Comments: 2+ DP/PT pulses bilaterally, Homans sign absent bilaterally, no lower extremity edema, no palpable cords, compartments are soft  Pulmonary:     Effort: Pulmonary effort is normal.  Abdominal:     General: There is no distension.  Musculoskeletal:        General: Tenderness present.     Right knee: She exhibits decreased range of motion and bony tenderness. She exhibits no deformity, no erythema, normal alignment, no LCL laxity, normal meniscus and no MCL laxity. Tenderness found. Medial joint line, lateral joint line, MCL and LCL tenderness noted. No patellar tendon tenderness noted.     Comments: Fairly diffuse tenderness to palpation of the right knee.  No varus or valgus instability, no ligamentous laxity.  Negative anterior/posterior drawer test.  5/5 strength of BLE major muscle groups.  She is able to extend the knee against resistance without difficulty.  No quadriceps tendon deformity.  Normal passive range of motion of the right hip.  Examination of the right ankle within normal limits.  Skin:    General: Skin is warm and dry.     Findings: No erythema.  Neurological:     Mental Status: She is alert.     Comments: Fluent speech, no facial droop, sensation intact to light touch of bilateral lower extremities.  Ambulates with antalgic gait but is able to bear weight and is able to heel walk and toe walk.  Psychiatric:         Behavior: Behavior normal.      ED Treatments / Results  Labs (all labs ordered are listed, but only abnormal results are displayed) Labs Reviewed - No data to display  EKG None  Radiology Dg Knee Complete 4 Views Right  Result Date: 05/31/2019 CLINICAL DATA:  Chronic knee pain, acutely worse after tripping on steps yesterday. EXAM: RIGHT KNEE - COMPLETE 4+ VIEW COMPARISON:  06/22/2018 FINDINGS: No fracture or dislocation. 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 No evidence of chondrocalcinosis. Ill-defined chondroid lesion within distal femoral metaphysis is unchanged and without associated adjacent periostitis. No joint effusion. Regional soft tissues appear normal. IMPRESSION: 1. No acute findings. 2. 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   On: 05/31/2019 11:05    Procedures Procedures (including critical care time)  Medications Ordered in ED Medications - No data to display   Initial Impression / Assessment and Plan / ED Course  I have reviewed the triage vital signs and the nursing notes.  Pertinent labs & imaging results that were available during my care of the patient were reviewed by me and considered in my medical decision making (see chart for details).        Patient presenting for evaluation of acute on chronic right knee pain after mechanical fall yesterday.  She is afebrile, vital signs are stable.  She is nontoxic in appearance.  She is neurovascularly intact.  Compartments are soft.  No signs of secondary skin infection.  Doubt DVT.  Radiographs obtained show no acute findings, stable tricompartmental degenerative changes similar to radiographs obtained 1 year ago.  No concern for tibial plateau fracture or quadriceps tendon  rupture at this time.  She is able to bear weight despite pain, ambulates with steady gait.  She has follow-up with her orthopedist.  Conservative therapy indicated and discussed.  Will discharge with knee immobilizer and crutches to use for comfort, weightbearing as tolerated.  Will discharge with course of naproxen.  Recommend follow-up with PCP or orthopedist for reevaluation, possible further imaging and/or referral to physical therapy.  Discussed strict ED return precautions. Patient verbalized understanding of and agreement with plan and is safe for discharge home at this time.   Final Clinical Impressions(s) / ED Diagnoses   Final diagnoses:  Acute pain of right knee    ED Discharge Orders         Ordered    naproxen (NAPROSYN) 500 MG tablet  2 times daily with meals     05/31/19 1241    acetaminophen (TYLENOL) 500 MG tablet  Every 6 hours PRN     05/31/19 1241           Jeanie SewerFawze, Artemus Romanoff A, PA-C 05/31/19 1243    Terrilee FilesButler, Michael C, MD 05/31/19 [phone]

## 2019-08-18 ENCOUNTER — Encounter: Payer: Self-pay | Admitting: Family Medicine

## 2019-08-18 ENCOUNTER — Other Ambulatory Visit: Payer: Self-pay | Admitting: Family Medicine

## 2019-08-18 DIAGNOSIS — F900 Attention-deficit hyperactivity disorder, predominantly inattentive type: Secondary | ICD-10-CM

## 2019-08-19 NOTE — Telephone Encounter (Signed)
Pt is scheduled for med refill on 08/22/2019

## 2019-08-26 ENCOUNTER — Telehealth (INDEPENDENT_AMBULATORY_CARE_PROVIDER_SITE_OTHER): Payer: Self-pay | Admitting: Family Medicine

## 2019-08-26 DIAGNOSIS — F429 Obsessive-compulsive disorder, unspecified: Secondary | ICD-10-CM

## 2019-08-26 DIAGNOSIS — F900 Attention-deficit hyperactivity disorder, predominantly inattentive type: Secondary | ICD-10-CM

## 2019-08-26 MED ORDER — AMPHETAMINE-DEXTROAMPHET ER 30 MG PO CP24: 30.0000 mg | ORAL_CAPSULE | ORAL | 0 refills | Status: DC

## 2019-08-26 MED ORDER — AMPHETAMINE-DEXTROAMPHET ER 25 MG PO CP24: 25.0000 mg | ORAL_CAPSULE | ORAL | 0 refills | Status: DC

## 2019-08-26 NOTE — Progress Notes (Signed)
Virtual Visit via Video Note  I connected with Paige Wilkinson on 08/26/19 at  2:00 PM EST by a video enabled telemedicine application 2/2 [id number] pandemic and verified that I am speaking with the correct person using two identifiers.  Location patient: home Location provider:work or home office Persons participating in the virtual visit: patient, provider  I discussed the limitations of evaluation and management by telemedicine and the availability of in person appointments. The patient expressed understanding and agreed to proceed.   HPI: Pt seen for f/u on ADHD.  States she is doing ok.  Back in school for nursing.  Current job has her working outside Producer, television/film/video.  Pt tried to stop taking Adderall, but she notices increased OCD and difficulty focusing.  Has to keep cart organized at work, frequently cleans things, and likes things done in a certain way.  Pt considering therapy, but will not have insurance until Sept.  Pt notes losing 13 family members from [place], [place], and sudden death.  Pt relieving stress by walking and feeding the birds.   ROS: See pertinent positives and negatives per HPI.  Past Medical History:  Diagnosis Date  . Depression   . History of chicken pox     Past Surgical History:  Procedure Laterality Date  . APPENDECTOMY    . TUBAL LIGATION      No family history on file.  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  Her daughter and her 2 kids moved in with her.  Pt quit her job at [employer].  Pt's husband was laid off last yr, so she is the sole provider.   Current Outpatient Medications:  .  acetaminophen (TYLENOL) 500 MG tablet, Take 1 tablet (500 mg total) by mouth every 6 (six) hours as needed., Disp: 30 tablet, Rfl: 0 .  amphetamine-dextroamphetamine (ADDERALL XR) 25 MG 24 hr capsule, Take 1 capsule by mouth every morning., Disp: 30 capsule, Rfl: 0 .  amphetamine-dextroamphetamine (ADDERALL XR) 30 MG 24 hr capsule, Take 1 capsule (30 mg  total) by mouth every morning., Disp: 30 capsule, Rfl: 0 .  amphetamine-dextroamphetamine (ADDERALL XR) 30 MG 24 hr capsule, Take 1 capsule (30 mg total) by mouth every morning., Disp: 30 capsule, Rfl: 0 .  amphetamine-dextroamphetamine (ADDERALL XR) 30 MG 24 hr capsule, Take 1 capsule (30 mg total) by mouth every morning., Disp: 30 capsule, Rfl: 0 .  Ascorbic Acid (VITAMIN C) 1000 MG tablet, Take 1,000 mg by mouth daily., Disp: , Rfl:  .  BIOTIN PO, Take by mouth., Disp: , Rfl:  .  cyanocobalamin 500 MCG tablet, Take 500 mcg by mouth daily., Disp: , Rfl:  .  fluconazole (DIFLUCAN) 150 MG tablet, Take 1 pill now.  Then repeat in 3 days if symptoms continue., Disp: 1 tablet, Rfl: 0 .  fluticasone (FLONASE) 50 MCG/ACT nasal spray, Place 1 spray into both nostrils daily., Disp: 16 g, Rfl: 4 .  naproxen (NAPROSYN) 500 MG tablet, Take 1 tablet (500 mg total) by mouth 2 (two) times daily with a meal., Disp: 30 tablet, Rfl: 0  EXAM:  VITALS per patient if applicable: RR 26-71 bpm  GENERAL: alert, oriented, appears well and in no acute distress  HEENT: atraumatic, conjunctiva clear, no obvious abnormalities on inspection of external nose and ears  NECK: normal movements of the head and neck  LUNGS: on inspection no signs of respiratory distress, breathing rate appears normal, no obvious gross SOB, gasping or wheezing  CV: no obvious  cyanosis  MS: moves all visible extremities without noticeable abnormality  PSYCH/NEURO: pleasant and cooperative, no obvious depression or anxiety, speech and thought processing grossly intact  ASSESSMENT AND PLAN:  Discussed the following assessment and plan:  Attention deficit hyperactivity disorder (ADHD), predominantly inattentive type  -stable -consider formal testing as ADHD symptoms can overlap with other conditions. -Adderall XR reduced from 30 to 25 mg in Sept 2/2 insomnia. - Plan: amphetamine-dextroamphetamine (ADDERALL XR) 25 MG 24 hr capsule,  amphetamine-dextroamphetamine (ADDERALL XR) 25 MG 24 hr capsule, amphetamine-dextroamphetamine (ADDERALL XR) 25 MG 24 hr capsule  Obsessive-compulsive disorder, unspecified type -discussed counseling.  Pt considering.  Would like to wait until has insurance if possible. -discussed medication options.  Pt declines at this time -will continue to monitor  F/u prn   I discussed the assessment and treatment plan with the patient. The patient was provided an opportunity to ask questions and all were answered. The patient agreed with the plan and demonstrated an understanding of the instructions.   The patient was advised to call back or seek an in-person evaluation if the symptoms worsen or if the condition fails to improve as anticipated.   Deeann Saint, MD

## 2019-10-11 ENCOUNTER — Encounter: Payer: Self-pay | Admitting: Family Medicine

## 2019-12-23 ENCOUNTER — Other Ambulatory Visit: Payer: Self-pay | Admitting: Family Medicine

## 2019-12-23 DIAGNOSIS — F900 Attention-deficit hyperactivity disorder, predominantly inattentive type: Secondary | ICD-10-CM

## 2019-12-30 NOTE — Telephone Encounter (Signed)
Left detailed message for pt to call the office and schedule a f/u visit for Adderall refill

## 2020-01-02 ENCOUNTER — Encounter: Payer: Self-pay | Admitting: Family Medicine

## 2020-01-02 ENCOUNTER — Telehealth (INDEPENDENT_AMBULATORY_CARE_PROVIDER_SITE_OTHER): Payer: Self-pay | Admitting: Family Medicine

## 2020-01-02 VITALS — BP 110/82 | HR 64 | Temp 98.8°F | Wt 176.0 lb

## 2020-01-02 DIAGNOSIS — H538 Other visual disturbances: Secondary | ICD-10-CM

## 2020-01-02 DIAGNOSIS — R55 Syncope and collapse: Secondary | ICD-10-CM

## 2020-01-02 DIAGNOSIS — F900 Attention-deficit hyperactivity disorder, predominantly inattentive type: Secondary | ICD-10-CM

## 2020-01-02 NOTE — Progress Notes (Signed)
Virtual Visit via Telephone Note  I connected with Paige Wilkinson on 01/02/20 at  4:00 PM EDT by telephone and verified that I am speaking with the correct person using two identifiers.   I discussed the limitations, risks, security and privacy concerns of performing an evaluation and management service by telephone and the availability of in person appointments. I also discussed with the patient that there may be a patient responsible charge related to this service. The patient expressed understanding and agreed to proceed.  Location patient: home Location provider: work or home office Participants present for the call: patient, provider Patient did not have a visit in the prior 7 days to address this/these issue(s).   History of Present Illness:h Pt is a 44 yo female with pmh sig for ADD seen for f/u.  Pt had a "health scare" on 5/22 where she felt dizzy early in the day.  Pt later had a syncopal episode with full body convulsions while in a park outside of [place].  Pt fell onto the concrete hitting her R side.  EMS was called, pt seen in ED at [facility].  CXR, CT scan, and labs were normal.  Pt does not recall the events prior to the episode.  Pt ate that day, denies EtOH use prior to the episode.  Pt started weight watchers.  Has lost 25 lbs.  Notes less knee pain since losing weight.  Pt has not had a chance to get her vision checked as she is without insurance.  Still having blurred vision.  Wearing readers now.   Observations/Objective: Patient sounds cheerful and well on the phone. I do not appreciate any SOB. Speech and thought processing are grossly intact. Patient reported vitals:  Assessment and Plan: Blurred vision  -discussed possible causes including hyper or hypoglycemia, thyroid dysfunction, HTN, visions issues -Plan: TSH, T4, free, Hemoglobin A1c  Attention deficit hyperactivity disorder (ADHD), predominantly inattentive type  -PDMP reviewed.  - Plan: [id number]  11+Oxyco+Alc+Crt-Bund  Syncope, convulsive  -no recurrent episodes -unable to see OSH records. -possibly 2/2 heat, hypo or hyperglycemia, seizure d/o -will obtain labs -consider referral to Neuro and CT head - Plan: Comprehensive metabolic panel, TSH, T4, free, CBC with Differential/Platelet, POCT urinalysis dipstick, Hemoglobin A1c, [id number] 11+Oxyco+Alc+Crt-Bund   Follow Up Instructions: F/u in the next month  I did not refer this patient for an OV in the next 24 hours for this/these issue(s).  I discussed the assessment and treatment plan with the patient. The patient was provided an opportunity to ask questions and all were answered. The patient agreed with the plan and demonstrated an understanding of the instructions.   The patient was advised to call back or seek an in-person evaluation if the symptoms worsen or if the condition fails to improve as anticipated.  I provided 13 minutes of non-face-to-face time during this encounter.   Deeann Saint, MD

## 2020-01-02 NOTE — Progress Notes (Signed)
Pt initially present on video 10 min prior to visit, then left.  Several attempt made to connect with pt on video however unsuccessful.

## 2020-01-03 ENCOUNTER — Encounter: Payer: Self-pay | Admitting: Family Medicine

## 2020-01-05 ENCOUNTER — Other Ambulatory Visit: Payer: Self-pay

## 2020-01-05 ENCOUNTER — Telehealth: Payer: Self-pay | Admitting: Family Medicine

## 2020-01-05 DIAGNOSIS — R55 Syncope and collapse: Secondary | ICD-10-CM

## 2020-01-05 DIAGNOSIS — H538 Other visual disturbances: Secondary | ICD-10-CM

## 2020-01-05 DIAGNOSIS — F900 Attention-deficit hyperactivity disorder, predominantly inattentive type: Secondary | ICD-10-CM

## 2020-01-05 NOTE — Telephone Encounter (Signed)
Pt was seen by video visit on 01/02/2020 meds refill and was checking to see when her adderall rx will be sent to pharmacy. Pt also was seen for labs today. Send prescriptions to: [facility] [address].

## 2020-01-06 ENCOUNTER — Other Ambulatory Visit: Payer: Self-pay | Admitting: Family Medicine

## 2020-01-06 DIAGNOSIS — F900 Attention-deficit hyperactivity disorder, predominantly inattentive type: Secondary | ICD-10-CM

## 2020-01-06 LAB — DM TEMPLATE

## 2020-01-06 LAB — CBC WITH DIFFERENTIAL/PLATELET
Absolute Monocytes: 663 cells/uL (ref 200–950)
Basophils Absolute: 51 cells/uL (ref 0–200)
Basophils Relative: 0.6 %
Eosinophils Absolute: 102 cells/uL (ref 15–500)
Eosinophils Relative: 1.2 %
HCT: 40.4 % (ref 35.0–45.0)
Hemoglobin: 13 g/dL (ref 11.7–15.5)
Lymphs Abs: 3536 cells/uL (ref 850–3900)
MCH: 28.6 pg (ref 27.0–33.0)
MCHC: 32.2 g/dL (ref 32.0–36.0)
MCV: 88.8 fL (ref 80.0–100.0)
MPV: 9.4 fL (ref 7.5–12.5)
Monocytes Relative: 7.8 %
Neutro Abs: 4148 cells/uL (ref 1500–7800)
Neutrophils Relative %: 48.8 %
Platelets: 351 10*3/uL (ref 140–400)
RBC: 4.55 10*6/uL (ref 3.80–5.10)
RDW: 11.7 % (ref 11.0–15.0)
Total Lymphocyte: 41.6 %
WBC: 8.5 10*3/uL (ref 3.8–10.8)

## 2020-01-06 LAB — T4, FREE: Free T4: 1.3 ng/dL (ref 0.8–1.8)

## 2020-01-06 LAB — COMPREHENSIVE METABOLIC PANEL
AG Ratio: 1.4 (calc) (ref 1.0–2.5)
ALT: 10 U/L (ref 6–29)
AST: 18 U/L (ref 10–30)
Albumin: 4.2 g/dL (ref 3.6–5.1)
Alkaline phosphatase (APISO): 97 U/L (ref 31–125)
BUN: 17 mg/dL (ref 7–25)
CO2: 25 mmol/L (ref 20–32)
Calcium: 9.1 mg/dL (ref 8.6–10.2)
Chloride: 102 mmol/L (ref 98–110)
Creat: 0.87 mg/dL (ref 0.50–1.10)
Globulin: 2.9 g/dL (calc) (ref 1.9–3.7)
Glucose, Bld: 92 mg/dL (ref 65–99)
Potassium: 4.3 mmol/L (ref 3.5–5.3)
Sodium: 136 mmol/L (ref 135–146)
Total Bilirubin: 0.2 mg/dL (ref 0.2–1.2)
Total Protein: 7.1 g/dL (ref 6.1–8.1)

## 2020-01-06 LAB — DRUG MONITORING, PANEL 8 WITH CONFIRMATION, URINE
6 Acetylmorphine: NEGATIVE ng/mL (ref <10)
Alcohol Metabolites: NEGATIVE ng/mL
Amphetamines: NEGATIVE ng/mL (ref <500)
Benzodiazepines: NEGATIVE ng/mL (ref <100)
Buprenorphine, Urine: NEGATIVE ng/mL (ref <5)
Cocaine Metabolite: NEGATIVE ng/mL (ref <150)
Creatinine: 61.6 mg/dL
MDMA: NEGATIVE ng/mL (ref <500)
Marijuana Metabolite: NEGATIVE ng/mL (ref <20)
Opiates: NEGATIVE ng/mL (ref <100)
Oxidant: NEGATIVE ug/mL
Oxycodone: NEGATIVE ng/mL (ref <100)
pH: 5.4 (ref 4.5–9.0)

## 2020-01-06 LAB — TSH: TSH: 0.63 mIU/L

## 2020-01-06 LAB — HEMOGLOBIN A1C
Hgb A1c MFr Bld: 5.2 % of total Hgb (ref <5.7)
Mean Plasma Glucose: 103 (calc)
eAG (mmol/L): 5.7 (calc)

## 2020-01-06 NOTE — Telephone Encounter (Signed)
Please advise 

## 2020-01-09 ENCOUNTER — Other Ambulatory Visit: Payer: Self-pay | Admitting: *Deleted

## 2020-01-09 DIAGNOSIS — F900 Attention-deficit hyperactivity disorder, predominantly inattentive type: Secondary | ICD-10-CM

## 2020-01-09 DIAGNOSIS — R55 Syncope and collapse: Secondary | ICD-10-CM

## 2020-01-09 NOTE — Telephone Encounter (Signed)
Pt has none left since June 27th and would like to see if possible to refill today at the pharmacy listed in the message below

## 2020-01-09 NOTE — Telephone Encounter (Signed)
Patient notified and stated " what should I do about being figity, the reason for the visit was to get medication, she didn't say that I wasn't going to get it during the visit. I have to wait until October for neurology because I do not have insurance."

## 2020-01-09 NOTE — Telephone Encounter (Signed)
Pt seen on 7/9 however ADHD was not discussed.  Given pt's recent syncopal episode and reported seizure like activity, would hold off on restarting adderall at this time.  Pt should consider f/u with Neurology.

## 2020-01-13 NOTE — Telephone Encounter (Signed)
Pt LOV was on 01/02/2020 and last refill was done on 08/26/2019 for 3 months supply pt state she is out and request refill, please advise

## 2020-01-16 NOTE — Telephone Encounter (Signed)
Spoke with pt state that she is aware to see Neurology. State that she has strep throat and she is not able to have an appointment until she gets better. State that she will follow up with them as soon as is better. Nothing further needed

## 2020-01-29 ENCOUNTER — Encounter: Payer: Self-pay | Admitting: Neurology

## 2020-02-01 IMAGING — DX DG KNEE COMPLETE 4+V*R*
4 series · 4 of 4 positions shown · non-contrast
Comparison: None.

CLINICAL DATA: Acute on chronic right knee pain after fall today.

EXAM:
RIGHT KNEE - COMPLETE 4+ VIEW

[knee ap]
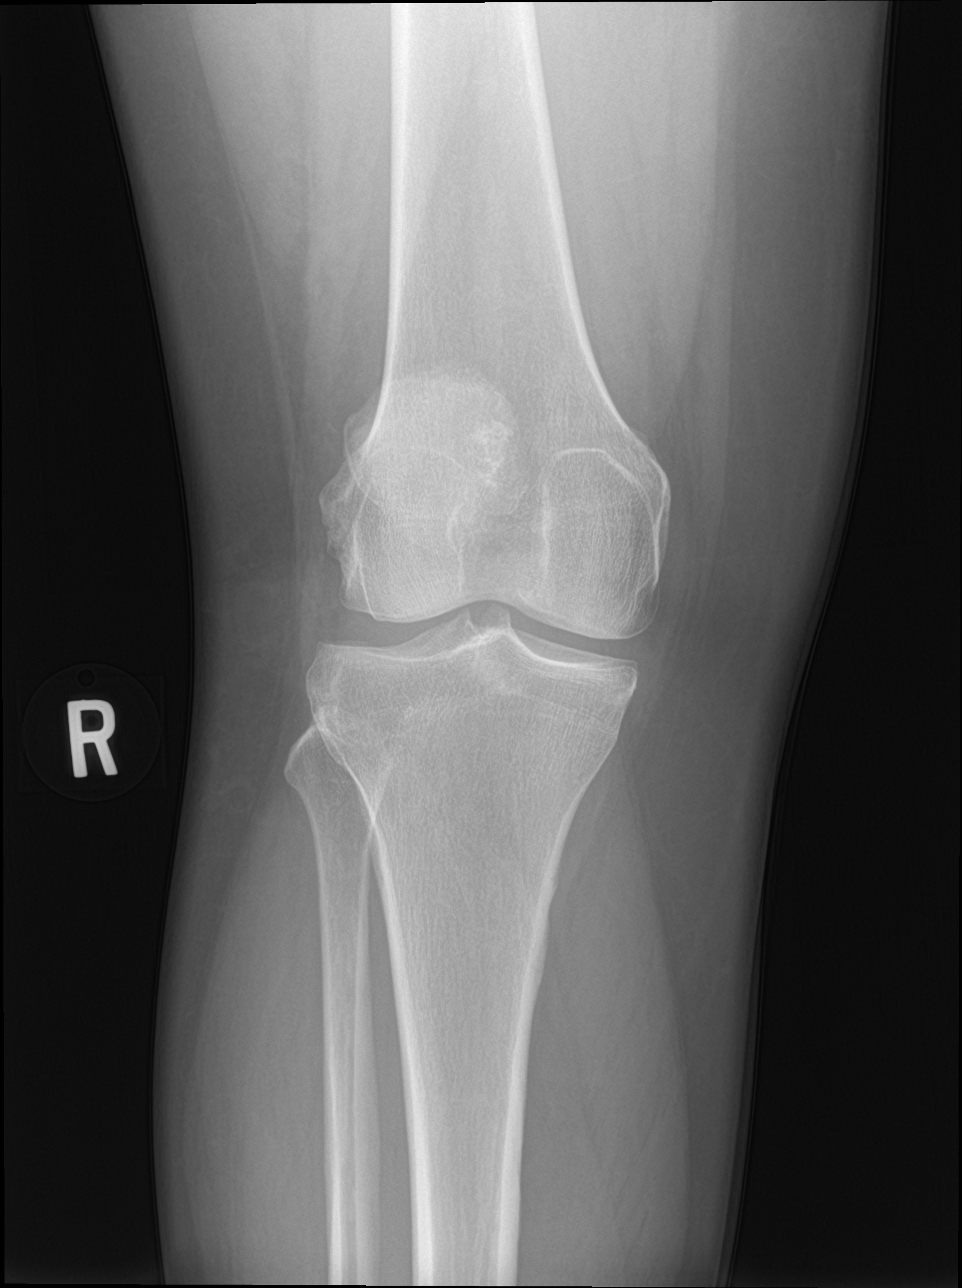

[knee obl (1 of 2)]
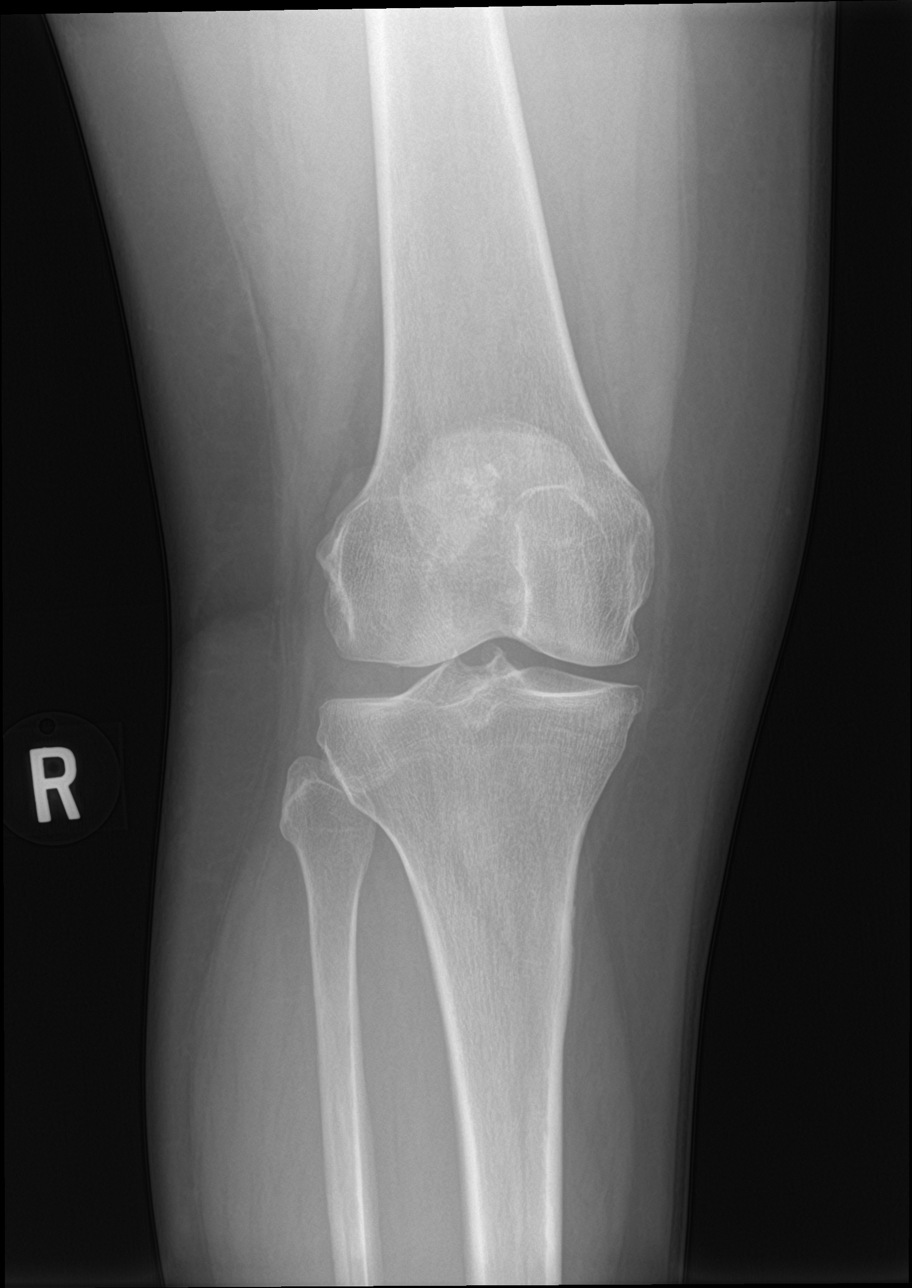

[knee obl (2 of 2)]
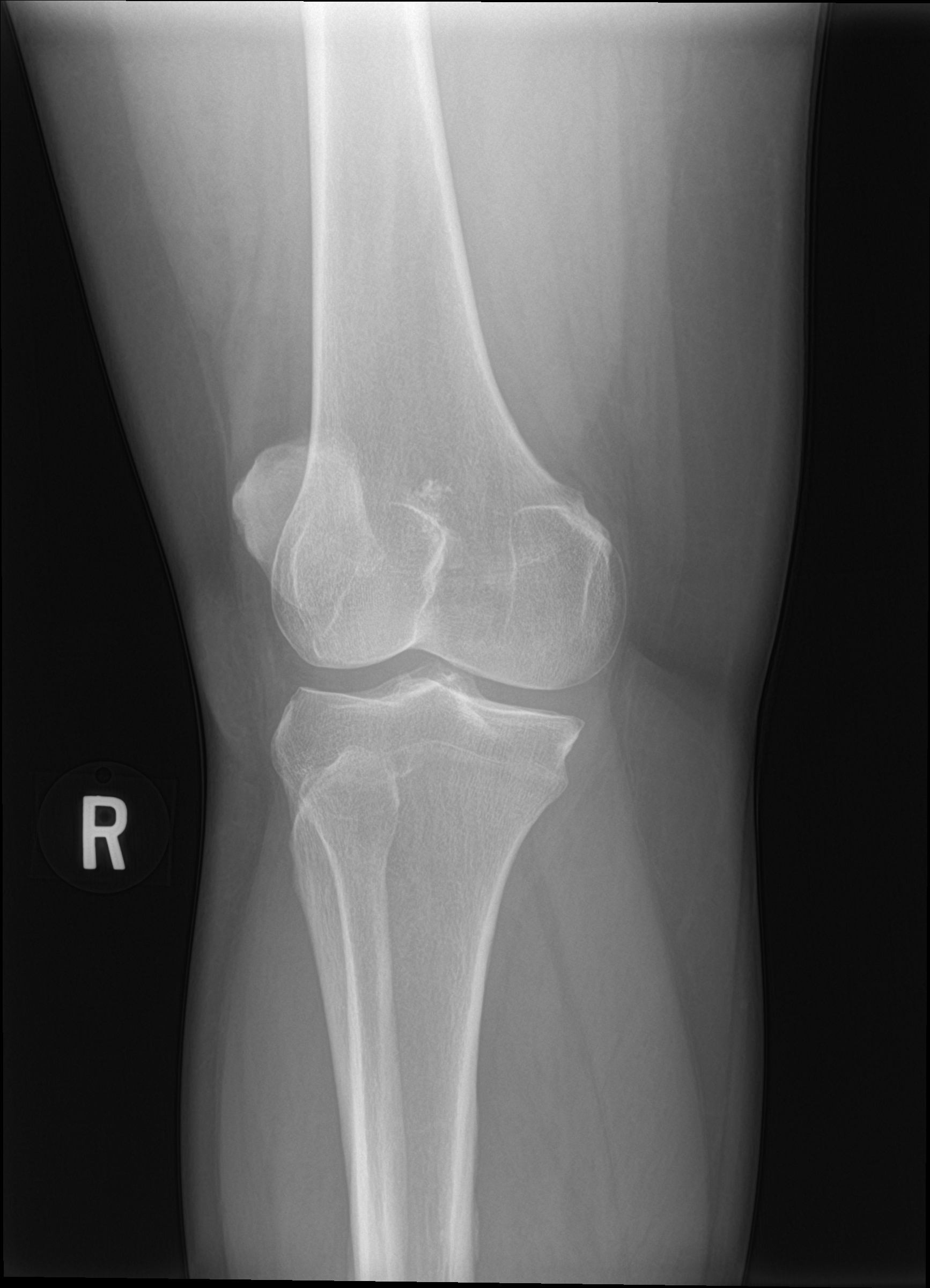

[knee lat]
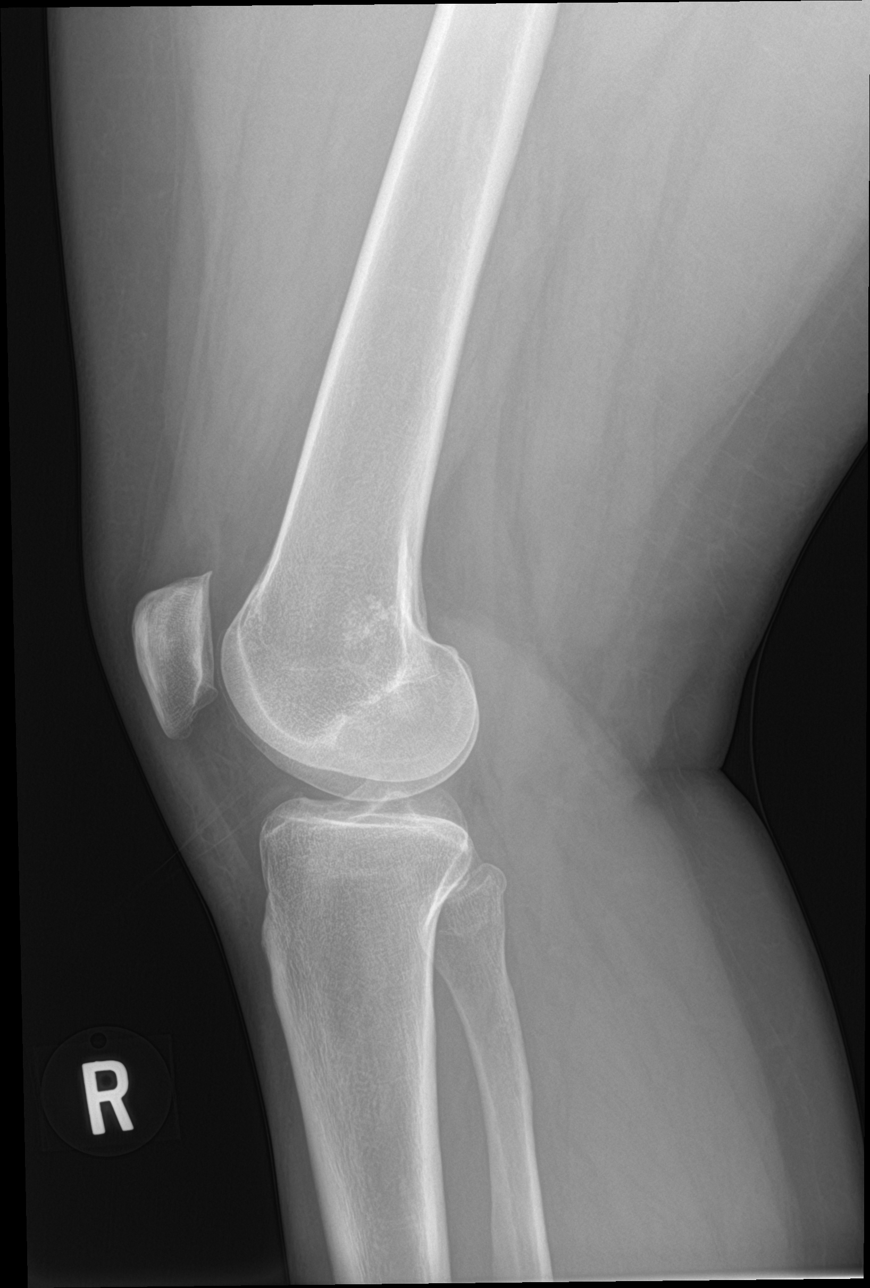

[4 of 4 positions shown; findings below may reference images not displayed]

FINDINGS: No evidence of fracture, dislocation, or joint effusion. No evidence
of arthropathy. Irregular calcifications are noted in the distal
right femur which most likely represents bone infarction. Soft
tissues are unremarkable.
IMPRESSION: No fracture or dislocation is noted. No significant degenerative
changes noted. Irregular calcifications are noted in the distal
right femur which most likely represents bone infarction. However,
if the patient has persistent pain in this area, MRI would be
recommended to rule out possible bone neoplasm.

## 2020-02-19 ENCOUNTER — Other Ambulatory Visit: Payer: Self-pay

## 2020-02-19 ENCOUNTER — Encounter: Payer: Self-pay | Admitting: Neurology

## 2020-02-19 ENCOUNTER — Ambulatory Visit: Payer: Self-pay | Admitting: Neurology

## 2020-02-19 VITALS — BP 117/78 | HR 83 | Ht 64.0 in | Wt 183.0 lb

## 2020-02-19 DIAGNOSIS — R55 Syncope and collapse: Secondary | ICD-10-CM

## 2020-02-19 NOTE — Patient Instructions (Signed)
1.  Will need to check MRI of brain with and without contrast 2.  Will need to check sleep-deprived EEG 3.  Follow up after testing.

## 2020-02-19 NOTE — Progress Notes (Signed)
NEUROLOGY CONSULTATION NOTE  Paige Wilkinson MRN: [id number] DOB: 30-Jun-1975  Referring provider: Abbe Amsterdam, MD Primary care provider: Abbe Amsterdam, MD  Reason for consult:  syncope  HISTORY OF PRESENT ILLNESS: Paige Wilkinson is a 44 year old right-handed female with ADD who presents for episode of syncope.  History supplemented by referring provider's note.  Hot that day.  Drinking fluids,  Lightheaded.  Laid down and went to family picnic.  Got out of car and on ground "seizing"  Woke up and felt pain and pressure in chest.  3-4 minutes.  No tongue and wet.  Bruise on right side of face.  1 alcoholic beverage.  On 5/22, she was at a family picnic when she passed out.  No preceding warning such as lightheadedness.  She was standing and talking and then next minute she was on the ground.  She says that her husband told her that she was "seizing".  She was unconscious for maybe 3 to 4 minutes.  No tongue biting or incontinence.  She had a bruise on the right side of her face from hitting the pavement.  When she woke up that morning, she said she didn't feel right.  She was outside in the heat for only a short while.  She did not skip any meals and was keeping hydrated.  She had only 1 alcoholic beverage.  EMS was called and brought her to the ED at [facility].  Said possible dehydration.  CT head, CXR and labs were normal.  She later followed up with her PCP in July.  CBC revealed no anemia.  CMP was normal.  Hgb A1c was 5.2.  TSH was 0.63.  UDS was negative.  Prior to this event, she reports blurred vision.  She has not had an eye exam yet as she has no insurance which won't start until October.  Denies palpitations.  She reports some memory problems which she attributes to being off of her ADD medication.    She reports history of mild concussion as a child.  No history of seizures.  No family history of seizures.  PAST MEDICAL HISTORY: Past Medical History:  Diagnosis Date  .  Depression   . History of chicken pox     PAST SURGICAL HISTORY: Past Surgical History:  Procedure Laterality Date  . APPENDECTOMY    . TUBAL LIGATION      MEDICATIONS: Current Outpatient Medications on File Prior to Visit  Medication Sig Dispense Refill  . acetaminophen (TYLENOL) 500 MG tablet Take 1 tablet (500 mg total) by mouth every 6 (six) hours as needed. 30 tablet 0  . amphetamine-dextroamphetamine (ADDERALL XR) 25 MG 24 hr capsule Take 1 capsule by mouth every morning. 30 capsule 0  . amphetamine-dextroamphetamine (ADDERALL XR) 25 MG 24 hr capsule Take 1 capsule by mouth every morning. 30 capsule 0  . amphetamine-dextroamphetamine (ADDERALL XR) 25 MG 24 hr capsule Take 1 capsule by mouth every morning. 30 capsule 0  . Ascorbic Acid (VITAMIN C) 1000 MG tablet Take 1,000 mg by mouth daily.    Marland Kitchen BIOTIN PO Take by mouth.    . cyanocobalamin 500 MCG tablet Take 500 mcg by mouth daily.    . fluconazole (DIFLUCAN) 150 MG tablet Take 1 pill now.  Then repeat in 3 days if symptoms continue. 1 tablet 0  . fluticasone (FLONASE) 50 MCG/ACT nasal spray Place 1 spray into both nostrils daily. 16 g 4  . naproxen (NAPROSYN) 500 MG tablet Take 1 tablet (  500 mg total) by mouth 2 (two) times daily with a meal. 30 tablet 0   No current facility-administered medications on file prior to visit.    ALLERGIES: No Known Allergies  FAMILY HISTORY: History reviewed. No pertinent family history.  SOCIAL HISTORY: Social History   Socioeconomic History  . Marital status: Married    Spouse name: Not on file  . Number of children: Not on file  . Years of education: Not on file  . Highest education level: Not on file  Occupational History  . Not on file  Tobacco Use  . Smoking status: Former Games developer  . Smokeless tobacco: Never Used  Vaping Use  . Vaping Use: Never used  Substance and Sexual Activity  . Alcohol use: Yes    Comment: rarely  . Drug use: No  . Sexual activity: Not on file    Other Topics Concern  . Not on file  Social History Narrative  . Not on file   Social Determinants of Health   Financial Resource Strain:   . Difficulty of Paying Living Expenses: Not on file  Food Insecurity:   . Worried About Programme researcher, broadcasting/film/video in the Last Year: Not on file  . Ran Out of Food in the Last Year: Not on file  Transportation Needs:   . Lack of Transportation (Medical): Not on file  . Lack of Transportation (Non-Medical): Not on file  Physical Activity:   . Days of Exercise per Week: Not on file  . Minutes of Exercise per Session: Not on file  Stress:   . Feeling of Stress : Not on file  Social Connections:   . Frequency of Communication with Friends and Family: Not on file  . Frequency of Social Gatherings with Friends and Family: Not on file  . Attends Religious Services: Not on file  . Active Member of Clubs or Organizations: Not on file  . Attends Banker Meetings: Not on file  . Marital Status: Not on file  Intimate Partner Violence:   . Fear of Current or Ex-Partner: Not on file  . Emotionally Abused: Not on file  . Physically Abused: Not on file  . Sexually Abused: Not on file    PHYSICAL EXAM: Blood pressure 117/78, pulse 83, height 5\' 4"  (1.626 m), weight 183 lb (83 kg), SpO2 99 %. General: No acute distress.  Patient appears well-groomed.   Head:  Normocephalic/atraumatic Eyes:  fundi examined but not visualized Neck: supple, no paraspinal tenderness, full range of motion Back: No paraspinal tenderness Heart: regular rate and rhythm Lungs: Clear to auscultation bilaterally. Vascular: No carotid bruits. Neurological Exam: Mental status: alert and oriented to person, place, and time, recent and remote memory intact, fund of knowledge intact, attention and concentration intact, speech fluent and not dysarthric, language intact. Cranial nerves: CN I: not tested CN II: pupils equal, round and reactive to light, visual fields  intact CN III, IV, VI:  full range of motion, no nystagmus, no ptosis CN V: facial sensation intact CN VII: upper and lower face symmetric CN VIII: hearing intact CN IX, X: gag intact, uvula midline CN XI: sternocleidomastoid and trapezius muscles intact CN XII: tongue midline Bulk & Tone: normal, no fasciculations. Motor:  5/5 throughout Sensation:  Pinprick and vibration sensation intact.   Deep Tendon Reflexes:  2+ throughout, toes downgoing.  Finger to nose testing:  Without dysmetria.   Heel to shin:  Without dysmetria.   Gait:  Normal station and stride.  Able to turn and tandem walk. Romberg negative.  IMPRESSION: 1.  Syncope.  Convulsive syncope vs seizure.  PLAN: 1.  She will need a seizure workup.  Unfortunately, she currently does not have insurance until October.  I advised her to contact us once her insurance is activated and we will place the orders: -  MRI of brain with and without contrast -  Sleep-deprived EEG 2.  Discussed [place] law stating that a person should not drive for 6 months after an unexplainable/non-provoked loss of consciousness. 3.  Follow up after testing.  Thank you for allowing me to take part in the care of this patient.  Shon Millet, DO  CC: Abbe Amsterdam, MD

## 2020-04-24 ENCOUNTER — Encounter: Payer: Self-pay | Admitting: Family Medicine

## 2020-04-28 DIAGNOSIS — R55 Syncope and collapse: Secondary | ICD-10-CM

## 2020-05-04 ENCOUNTER — Encounter: Payer: Self-pay | Admitting: Family Medicine

## 2020-05-17 NOTE — Progress Notes (Signed)
NEUROLOGY FOLLOW UP OFFICE NOTE  Paige Wilkinson [id number]   Subjective:  Paige Wilkinson is a 44 year old right-handed female with ADD who follows up for syncope.  UPDATE: She did not have the MRI brain or EEG performed because she didn't have insurance.  She now has insurance and MRI is scheduled for 11/27 and EEG schedules for 12/2.  She reports blurred vision from time to time but no other syncopal spells.  She hasn't been on the Adderall.    HISTORY: On 11/15/2019, she was at a family picnic when she passed out.  No preceding warning such as lightheadedness.  She was standing and talking and then next minute she was on the ground.  She says that her husband told her that she was "seizing".  She was unconscious for maybe 3 to 4 minutes.  No tongue biting or incontinence.  She had a bruise on the right side of her face from hitting the pavement.  When she woke up that morning, she said she didn't feel right.  She was outside in the heat for only a short while.  She did not skip any meals and was keeping hydrated.  She had only 1 alcoholic beverage.  EMS was called and brought her to the ED at [facility].  Said possible dehydration.  CT head, CXR and labs were normal.  She later followed up with her PCP in July.  CBC revealed no anemia.  CMP was normal.  Hgb A1c was 5.2.  TSH was 0.63.  UDS was negative.  Prior to this event, she reports blurred vision.  She has not had an eye exam yet as she has no insurance which won't start until October.  Denies palpitations.  She reports some memory problems which she attributes to being off of her ADD medication.    She reports history of mild concussion as a child.  No history of seizures.  No family history of seizures.  PAST MEDICAL HISTORY: Past Medical History:  Diagnosis Date  . Depression   . History of chicken pox     MEDICATIONS: Current Outpatient Medications on File Prior to Visit  Medication Sig Dispense Refill  . acetaminophen  (TYLENOL) 500 MG tablet Take 1 tablet (500 mg total) by mouth every 6 (six) hours as needed. 30 tablet 0  . amphetamine-dextroamphetamine (ADDERALL XR) 25 MG 24 hr capsule Take 1 capsule by mouth every morning. (Patient not taking: Reported on 02/19/2020) 30 capsule 0  . amphetamine-dextroamphetamine (ADDERALL XR) 25 MG 24 hr capsule Take 1 capsule by mouth every morning. (Patient not taking: Reported on 02/19/2020) 30 capsule 0  . amphetamine-dextroamphetamine (ADDERALL XR) 25 MG 24 hr capsule Take 1 capsule by mouth every morning. (Patient not taking: Reported on 02/19/2020) 30 capsule 0  . Ascorbic Acid (VITAMIN C) 1000 MG tablet Take 1,000 mg by mouth daily.    Marland Kitchen BIOTIN PO Take by mouth.    . cyanocobalamin 500 MCG tablet Take 500 mcg by mouth daily.    . fluconazole (DIFLUCAN) 150 MG tablet Take 1 pill now.  Then repeat in 3 days if symptoms continue. (Patient not taking: Reported on 02/19/2020) 1 tablet 0  . fluticasone (FLONASE) 50 MCG/ACT nasal spray Place 1 spray into both nostrils daily. 16 g 4  . naproxen (NAPROSYN) 500 MG tablet Take 1 tablet (500 mg total) by mouth 2 (two) times daily with a meal. 30 tablet 0   No current facility-administered medications on file prior to visit.  ALLERGIES: No Known Allergies  FAMILY HISTORY: No family history on file.   SOCIAL HISTORY: Social History   Socioeconomic History  . Marital status: Married    Spouse name: Not on file  . Number of children: Not on file  . Years of education: Not on file  . Highest education level: Not on file  Occupational History  . Not on file  Tobacco Use  . Smoking status: Former Games developer  . Smokeless tobacco: Never Used  Vaping Use  . Vaping Use: Never used  Substance and Sexual Activity  . Alcohol use: Yes    Comment: rarely  . Drug use: No  . Sexual activity: Not on file  Other Topics Concern  . Not on file  Social History Narrative   Right Handed   One Story Home   Drinks Caffeine - 2 cups a  day   Social Determinants of Health   Financial Resource Strain:   . Difficulty of Paying Living Expenses: Not on file  Food Insecurity:   . Worried About Programme researcher, broadcasting/film/video in the Last Year: Not on file  . Ran Out of Food in the Last Year: Not on file  Transportation Needs:   . Lack of Transportation (Medical): Not on file  . Lack of Transportation (Non-Medical): Not on file  Physical Activity:   . Days of Exercise per Week: Not on file  . Minutes of Exercise per Session: Not on file  Stress:   . Feeling of Stress : Not on file  Social Connections:   . Frequency of Communication with Friends and Family: Not on file  . Frequency of Social Gatherings with Friends and Family: Not on file  . Attends Religious Services: Not on file  . Active Member of Clubs or Organizations: Not on file  . Attends Banker Meetings: Not on file  . Marital Status: Not on file  Intimate Partner Violence:   . Fear of Current or Ex-Partner: Not on file  . Emotionally Abused: Not on file  . Physically Abused: Not on file  . Sexually Abused: Not on file     Objective:  Blood pressure 113/74, pulse 84, resp. rate 20, height 5\' 4"  (1.626 m), weight 186 lb (84.4 kg), SpO2 100 %. General: No acute distress.  Patient appears well-groomed.     Assessment/Plan:   Syncope - convulsive syncope vs seizure  1.  MRI of brain 2.  EEG 3.  May resume driving 4.  Follow up after testing.  , DO  CC: Shon Millet, MD

## 2020-05-19 ENCOUNTER — Encounter: Payer: Self-pay | Admitting: Neurology

## 2020-05-19 ENCOUNTER — Ambulatory Visit (INDEPENDENT_AMBULATORY_CARE_PROVIDER_SITE_OTHER): Payer: BC Managed Care – PPO | Admitting: Neurology

## 2020-05-19 ENCOUNTER — Other Ambulatory Visit: Payer: Self-pay

## 2020-05-19 VITALS — BP 113/74 | HR 84 | Resp 20 | Ht 64.0 in | Wt 186.0 lb

## 2020-05-19 DIAGNOSIS — R55 Syncope and collapse: Secondary | ICD-10-CM

## 2020-05-19 NOTE — Patient Instructions (Signed)
1.  MRI of brain 2.  EEG 3.  You may resume driving 4.  Follow up

## 2020-05-22 ENCOUNTER — Other Ambulatory Visit: Payer: Self-pay

## 2020-05-22 ENCOUNTER — Ambulatory Visit
Admission: RE | Admit: 2020-05-22 | Discharge: 2020-05-22 | Disposition: A | Discharge status: Home / Self Care | Payer: BC Managed Care – PPO | Source: Ambulatory Visit | Attending: Neurology | Admitting: Neurology

## 2020-05-22 DIAGNOSIS — R55 Syncope and collapse: Secondary | ICD-10-CM | POA: Diagnosis not present

## 2020-05-22 DIAGNOSIS — G930 Cerebral cysts: Secondary | ICD-10-CM | POA: Diagnosis not present

## 2020-05-22 DIAGNOSIS — R9082 White matter disease, unspecified: Secondary | ICD-10-CM | POA: Diagnosis not present

## 2020-05-22 MED ORDER — GADOBENATE DIMEGLUMINE 529 MG/ML IV SOLN
17.0000 mL | Freq: Once | INTRAVENOUS | Status: AC | PRN
  Administered 2020-05-22: 17 mL via INTRAVENOUS

## 2020-05-25 ENCOUNTER — Telehealth: Payer: Self-pay

## 2020-05-25 NOTE — Telephone Encounter (Signed)
-----   Message from Adam R Jaffe, DO sent at 05/24/2020 11:53 AM EST ----- Reviewed MRI of brain.  Overall, nothing concerning.  There is a very tiny cyst, which is typically asymptomatic.  It is possible that it could be a cause of seizure, however it is so tiny, I don't think this is relevant.  There are very few very tiny spots seen on the brain likely of no clinical significance.  Sometimes we see very tiny spots for no apparent reason.  In all, I do not see anything concerning that would have caused her to pass out.   

## 2020-05-25 NOTE — Telephone Encounter (Signed)
-----   Message from Drema Dallas, DO sent at 05/24/2020 11:53 AM EST ----- Reviewed MRI of brain.  Overall, nothing concerning.  There is a very tiny cyst, which is typically asymptomatic.  It is possible that it could be a cause of seizure, however it is so tiny, I don't think this is relevant.  There are very few very tiny spots seen on the brain likely of no clinical significance.  Sometimes we see very tiny spots for no apparent reason.  In all, I do not see anything concerning that would have caused her to pass out.

## 2020-05-25 NOTE — Telephone Encounter (Signed)
Patient returned call and was informed of results. Patient wanted to know if she should proceed with EEG. I informed patient that nothing in the note states otherwise and to proceed with testing as scheduled. Patient verbalized understanding and had no questions or concerns.

## 2020-05-25 NOTE — Telephone Encounter (Signed)
Called patient and left a message for a call back.  

## 2020-05-27 ENCOUNTER — Other Ambulatory Visit: Payer: Self-pay

## 2020-05-27 ENCOUNTER — Ambulatory Visit (INDEPENDENT_AMBULATORY_CARE_PROVIDER_SITE_OTHER): Payer: BC Managed Care – PPO | Admitting: Neurology

## 2020-05-27 DIAGNOSIS — R55 Syncope and collapse: Secondary | ICD-10-CM

## 2020-05-28 ENCOUNTER — Telehealth: Payer: Self-pay

## 2020-05-28 NOTE — Telephone Encounter (Signed)
Called patient and informed her of normal EEG results. Patient verbalized understanding.

## 2020-05-28 NOTE — Telephone Encounter (Signed)
-----   Message from Drema Dallas, DO sent at 05/28/2020  7:37 AM EST ----- EEG is normal

## 2020-05-28 NOTE — Procedures (Signed)
SLEEP-DEPRIVED ELECTROENCEPHALOGRAM REPORT  Date of Study: 05/27/2020  Patient's Name: Paige Wilkinson MRN: [id number] Date of Birth: 19-Feb-1976   Clinical History: 44 year old female with episode of syncope.  No history of seizures.  Medications: TYLENOL 500 MG tablet VITAMIN C 1000 MG tablet BIOTIN PO FLONASE 50 MCG/ACT nasal spray NAPROSYN 500 MG tablet  Technical Summary: A multichannel digital EEG recording measured by the international 10-20 system with electrodes applied with paste and impedances below 5000 ohms performed in our laboratory with EKG monitoring in an awake and asleep patient.  Hyperventilation was not performed due to patient wearing a face mask for the COVID-19 pandemic.  Photic stimulation was performed.  The digital EEG was referentially recorded, reformatted, and digitally filtered in a variety of bipolar and referential montages for optimal display.    Description: The patient is awake and asleep during the recording.  During maximal wakefulness, there is a symmetric, medium voltage 11 Hz posterior dominant rhythm that attenuates with eye opening.  The record is symmetric.  During drowsiness and sleep, there is an increase in theta slowing of the background.  Vertex waves and symmetric sleep spindles were seen.  Hyperventilation and photic stimulation did not elicit any abnormalities.  There were no epileptiform discharges or electrographic seizures seen.    EKG lead was unremarkable.  Impression: This awake and asleep EEG is normal.    Clinical Correlation: A normal EEG does not exclude a clinical diagnosis of epilepsy.  If further clinical questions remain, prolonged EEG may be helpful.  Clinical correlation is advised.   Shon Millet, DO

## 2020-06-03 ENCOUNTER — Ambulatory Visit (INDEPENDENT_AMBULATORY_CARE_PROVIDER_SITE_OTHER): Payer: BC Managed Care – PPO | Admitting: Family Medicine

## 2020-06-03 ENCOUNTER — Other Ambulatory Visit: Payer: Self-pay

## 2020-06-03 ENCOUNTER — Encounter: Payer: Self-pay | Admitting: Family Medicine

## 2020-06-03 VITALS — BP 118/70 | HR 91 | Temp 98.0°F | Wt 187.8 lb

## 2020-06-03 DIAGNOSIS — R5383 Other fatigue: Secondary | ICD-10-CM

## 2020-06-03 DIAGNOSIS — F419 Anxiety disorder, unspecified: Secondary | ICD-10-CM

## 2020-06-03 DIAGNOSIS — L679 Hair color and hair shaft abnormality, unspecified: Secondary | ICD-10-CM | POA: Diagnosis not present

## 2020-06-03 DIAGNOSIS — F321 Major depressive disorder, single episode, moderate: Secondary | ICD-10-CM | POA: Diagnosis not present

## 2020-06-03 MED ORDER — SERTRALINE HCL 25 MG PO TABS: 25.0000 mg | ORAL_TABLET | Freq: Every day | ORAL | 1 refills | Status: DC

## 2020-06-03 NOTE — Progress Notes (Signed)
Subjective:    Patient ID: Paige Wilkinson, female    DOB: August 23, 1975, 44 y.o.   MRN: [id number]  No chief complaint on file.   HPI Patient was seen today for ongoing concerns.  Pt notes increased anxiety and depression symptoms.  Interested in medication options.  Pt endorses increased stress as she is in school.  Notes becoming increasingly annoyed with people and does not want it to affect her job.  Pt has noticed fatigue,changes in her hair/shedding more.  Past Medical History:  Diagnosis Date  . Depression   . History of chicken pox     No Known Allergies  ROS General: Denies fever, chills, night sweats, changes in weight, changes in appetite +chnages in hair, fatigue HEENT: Denies headaches, ear pain, changes in vision, rhinorrhea, sore throat CV: Denies CP, palpitations, SOB, orthopnea Pulm: Denies SOB, cough, wheezing GI: Denies abdominal pain, nausea, vomiting, diarrhea, constipation GU: Denies dysuria, hematuria, frequency, vaginal discharge Msk: Denies muscle cramps, joint pains Neuro: Denies weakness, numbness, tingling Skin: Denies rashes, bruising Psych: Denies hallucinations  +anxiety, depression     Objective:    Blood pressure 118/70, pulse 91, temperature 98 F (36.7 C), temperature source Oral, weight 187 lb 12.8 oz (85.2 kg), SpO2 99 %.  Gen. Pleasant, well-nourished, in no distress, mildly depressed affect   HEENT: [place]/AT, face symmetric, conjunctiva clear, no scleral icterus, PERRLA, EOMI, nares patent without drainage Lungs: no accessory muscle use, CTAB, no wheezes or rales Cardiovascular: RRR, no m/r/g, no peripheral edema Musculoskeletal: No deformities, no cyanosis or clubbing, normal tone Neuro:  A&Ox3, CN II-XII intact, normal gait Skin:  Warm, no lesions/ rash   Wt Readings from Last 3 Encounters:  06/03/20 187 lb 12.8 oz (85.2 kg)  05/19/20 186 lb (84.4 kg)  02/19/20 183 lb (83 kg)    Lab Results  Component Value Date   WBC 8.5  01/05/2020   HGB 13.0 01/05/2020   HCT 40.4 01/05/2020   PLT 351 01/05/2020   GLUCOSE 92 01/05/2020   CHOL 141 03/12/2019   TRIG 67.0 03/12/2019   HDL 57.40 03/12/2019   LDLCALC 71 03/12/2019   ALT 10 01/05/2020   AST 18 01/05/2020   NA 136 01/05/2020   K 4.3 01/05/2020   CL 102 01/05/2020   CREATININE 0.87 01/05/2020   BUN 17 01/05/2020   CO2 25 01/05/2020   TSH 0.63 01/05/2020   HGBA1C 5.2 01/05/2020    Assessment/Plan:  Anxiety  -GAD 7 score 13 -discussed counseling and medication options -will start zoloft 25 mg -discussed self care -given handout -f/u in 4-6 wks, sooner if needed - Plan: TSH, T4, free, sertraline (ZOLOFT) 25 MG tablet  Depression, major, single episode, moderate (HCC) -PHQ 9 score 12 -discussed medication and counseling options -will start zoloft 25 mg  - Plan: TSH, T4, free, sertraline (ZOLOFT) 25 MG tablet  Hair changes  - Plan: TSH, T4, free  Fatigue, unspecified type  -likely 2/2 depression.  Also consider vitamin def or thyroid dysfunction. -will obtain labs -consider exercising - Plan: Vitamin D, 25-hydroxy  F/u in 4-6 wks, sooner if needed.  Abbe Amsterdam, MD

## 2020-06-03 NOTE — Patient Instructions (Addendum)
Look into when classes start and how long they will be.  Created budget.  We will have you follow-up in 1 month to see how things are going.  Fatigue If you have fatigue, you feel tired all the time and have a lack of energy or a lack of motivation. Fatigue may make it difficult to start or complete tasks because of exhaustion. In general, occasional or mild fatigue is often a normal response to activity or life. However, long-lasting (chronic) or extreme fatigue may be a symptom of a medical condition. Follow these instructions at home: General instructions  Watch your fatigue for any changes.  Go to bed and get up at the same time every day.  Avoid fatigue by pacing yourself during the day and getting enough sleep at night.  Maintain a healthy weight. Medicines  Take over-the-counter and prescription medicines only as told by your health care provider.  Take a multivitamin, if told by your health care provider.  Do not use herbal or dietary supplements unless they are approved by your health care provider. Activity   Exercise regularly, as told by your health care provider.  Use or practice techniques to help you relax, such as yoga, tai chi, meditation, or massage therapy. Eating and drinking   Avoid heavy meals in the evening.  Eat a well-balanced diet, which includes lean proteins, whole grains, plenty of fruits and vegetables, and low-fat dairy products.  Avoid consuming too much caffeine.  Avoid the use of alcohol.  Drink enough fluid to keep your urine pale yellow. Lifestyle  Change situations that cause you stress. Try to keep your work and personal schedule in balance.  Do not use any products that contain nicotine or tobacco, such as cigarettes and e-cigarettes. If you need help quitting, ask your health care provider.  Do not use drugs. Contact a health care provider if:  Your fatigue does not get better.  You have a fever.  You suddenly lose or  gain weight.  You have headaches.  You have trouble falling asleep or sleeping through the night.  You feel angry, guilty, anxious, or sad.  You are unable to have a bowel movement (constipation).  Your skin is dry.  You have swelling in your legs or another part of your body. Get help right away if:  You feel confused.  Your vision is blurry.  You feel faint or you pass out.  You have a severe headache.  You have severe pain in your abdomen, your back, or the area between your waist and hips (pelvis).  You have chest pain, shortness of breath, or an irregular or fast heartbeat.  You are unable to urinate, or you urinate less than normal.  You have abnormal bleeding, such as bleeding from the rectum, vagina, nose, lungs, or nipples.  You vomit blood.  You have thoughts about hurting yourself or others. If you ever feel like you may hurt yourself or others, or have thoughts about taking your own life, get help right away. You can go to your nearest emergency department or call:  Your local emergency services (911 in the [place]  A suicide crisis helpline, such as the [place] at 9728164082. This is open 24 hours a day. Summary  If you have fatigue, you feel tired all the time and have a lack of energy or a lack of motivation.  Fatigue may make it difficult to start or complete tasks because of exhaustion.  Long-lasting (chronic) or extreme  fatigue may be a symptom of a medical condition.  Exercise regularly, as told by your health care provider.  Change situations that cause you stress. Try to keep your work and personal schedule in balance. This information is not intended to replace advice given to you by your health care provider. Make sure you discuss any questions you have with your health care provider. Document Revised: 01/01/2019 Document Reviewed: 03/07/2017 Elsevier Patient Education  [place],  Adult After being diagnosed with an anxiety disorder, you may be relieved to know why you have felt or behaved a certain way. You may also feel overwhelmed about the treatment ahead and what it will mean for your life. With care and support, you can manage this condition and recover from it. How to manage lifestyle changes Managing stress and anxiety  Stress is your body's reaction to life changes and events, both good and bad. Most stress will last just a few hours, but stress can be ongoing and can lead to more than just stress. Although stress can play a major role in anxiety, it is not the same as anxiety. Stress is usually caused by something external, such as a deadline, test, or competition. Stress normally passes after the triggering event has ended.  Anxiety is caused by something internal, such as imagining a terrible outcome or worrying that something will go wrong that will devastate you. Anxiety often does not go away even after the triggering event is over, and it can become long-term (chronic) worry. It is important to understand the differences between stress and anxiety and to manage your stress effectively so that it does not lead to an anxious response. Talk with your health care provider or a counselor to learn more about reducing anxiety and stress. He or she may suggest tension reduction techniques, such as:  Music therapy. This can include creating or listening to music that you enjoy and that inspires you.  Mindfulness-based meditation. This involves being aware of your normal breaths while not trying to control your breathing. It can be done while sitting or walking.  Centering prayer. This involves focusing on a word, phrase, or sacred image that means something to you and brings you peace.  Deep breathing. To do this, expand your stomach and inhale slowly through your nose. Hold your breath for 3-5 seconds. Then exhale slowly, letting your stomach muscles  relax.  Self-talk. This involves identifying thought patterns that lead to anxiety reactions and changing those patterns.  Muscle relaxation. This involves tensing muscles and then relaxing them. Choose a tension reduction technique that suits your lifestyle and personality. These techniques take time and practice. Set aside 5-15 minutes a day to do them. Therapists can offer counseling and training in these techniques. The training to help with anxiety may be covered by some insurance plans. Other things you can do to manage stress and anxiety include:  Keeping a stress/anxiety diary. This can help you learn what triggers your reaction and then learn ways to manage your response.  Thinking about how you react to certain situations. You may not be able to control everything, but you can control your response.  Making time for activities that help you relax and not feeling guilty about spending your time in this way.  Visual imagery and yoga can help you stay calm and relax.  Medicines Medicines can help ease symptoms. Medicines for anxiety include:  Anti-anxiety drugs.  Antidepressants. Medicines are often used as a primary treatment  for anxiety disorder. Medicines will be prescribed by a health care provider. When used together, medicines, psychotherapy, and tension reduction techniques may be the most effective treatment. Relationships Relationships can play a big part in helping you recover. Try to spend more time connecting with trusted friends and family members. Consider going to couples counseling, taking family education classes, or going to family therapy. Therapy can help you and others better understand your condition. How to recognize changes in your anxiety Everyone responds differently to treatment for anxiety. Recovery from anxiety happens when symptoms decrease and stop interfering with your daily activities at home or work. This may mean that you will start to:  Have  better concentration and focus. Worry will interfere less in your daily thinking.  Sleep better.  Be less irritable.  Have more energy.  Have improved memory. It is important to recognize when your condition is getting worse. Contact your health care provider if your symptoms interfere with home or work and you feel like your condition is not improving. Follow these instructions at home: Activity  Exercise. Most adults should do the following: ? Exercise for at least 150 minutes each week. The exercise should increase your heart rate and make you sweat (moderate-intensity exercise). ? Strengthening exercises at least twice a week.  Get the right amount and quality of sleep. Most adults need 7-9 hours of sleep each night. Lifestyle   Eat a healthy diet that includes plenty of vegetables, fruits, whole grains, low-fat dairy products, and lean protein. Do not eat a lot of foods that are high in solid fats, added sugars, or salt.  Make choices that simplify your life.  Do not use any products that contain nicotine or tobacco, such as cigarettes, e-cigarettes, and chewing tobacco. If you need help quitting, ask your health care provider.  Avoid caffeine, alcohol, and certain over-the-counter cold medicines. These may make you feel worse. Ask your pharmacist which medicines to avoid. General instructions  Take over-the-counter and prescription medicines only as told by your health care provider.  Keep all follow-up visits as told by your health care provider. This is important. Where to find support You can get help and support from these sources:  Self-help groups.  Online and [employer].  A trusted spiritual leader.  Couples counseling.  Family education classes.  Family therapy. Where to find more information You may find that joining a support group helps you deal with your anxiety. The following sources can help you locate counselors or support groups near  you:  [place]: www.mentalhealthamerica.net  Anxiety and Depression Association of [place] (ADAA): https://www.clark.net/  National Alliance on Mental Illness (NAMI): www.nami.org Contact a health care provider if you:  Have a hard time staying focused or finishing daily tasks.  Spend many hours a day feeling worried about everyday life.  Become exhausted by worry.  Start to have headaches, feel tense, or have nausea.  Urinate more than normal.  Have diarrhea. Get help right away if you have:  A racing heart and shortness of breath.  Thoughts of hurting yourself or others. If you ever feel like you may hurt yourself or others, or have thoughts about taking your own life, get help right away. You can go to your nearest emergency department or call:  Your local emergency services (911 in the [place]  A suicide crisis helpline, such as the [place] at 3477129487. This is open 24 hours a day. Summary  Taking steps to learn and  use tension reduction techniques can help calm you and help prevent triggering an anxiety reaction.  When used together, medicines, psychotherapy, and tension reduction techniques may be the most effective treatment.  Family, friends, and partners can play a big part in helping you recover from an anxiety disorder. This information is not intended to replace advice given to you by your health care provider. Make sure you discuss any questions you have with your health care provider. Document Revised: 11/12/2018 Document Reviewed: 11/12/2018 Elsevier Patient Education  [place] With Depression Everyone experiences occasional disappointment, sadness, and loss in their lives. When you are feeling down, blue, or sad for at least 2 weeks in a row, it may mean that you have depression. Depression can affect your thoughts and feelings, relationships, daily activities, and physical health. It is caused by  changes in the way your brain functions. If you receive a diagnosis of depression, your health care provider will tell you which type of depression you have and what treatment options are available to you. If you are living with depression, there are ways to help you recover from it and also ways to prevent it from coming back. How to cope with lifestyle changes Coping with stress     Stress is your body's reaction to life changes and events, both good and bad. Stressful situations may include:  Getting married.  The death of a spouse.  Losing a job.  Retiring.  Having a baby. Stress can last just a few hours or it can be ongoing. Stress can play a major role in depression, so it is important to learn both how to cope with stress and how to think about it differently. Talk with your health care provider or a counselor if you would like to learn more about stress reduction. He or she may suggest some stress reduction techniques, such as:  Music therapy. This can include creating music or listening to music. Choose music that you enjoy and that inspires you.  Mindfulness-based meditation. This kind of meditation can be done while sitting or walking. It involves being aware of your normal breaths, rather than trying to control your breathing.  Centering prayer. This is a kind of meditation that involves focusing on a spiritual word or phrase. Choose a word, phrase, or sacred image that is meaningful to you and that brings you peace.  Deep breathing. To do this, expand your stomach and inhale slowly through your nose. Hold your breath for 3-5 seconds, then exhale slowly, allowing your stomach muscles to relax.  Muscle relaxation. This involves intentionally tensing muscles then relaxing them. Choose a stress reduction technique that fits your lifestyle and personality. Stress reduction techniques take time and practice to develop. Set aside 5-15 minutes a day to do them. Therapists can  offer training in these techniques. The training may be covered by some insurance plans. Other things you can do to manage stress include:  Keeping a stress diary. This can help you learn what triggers your stress and ways to control your response.  Understanding what your limits are and saying no to requests or events that lead to a schedule that is too full.  Thinking about how you respond to certain situations. You may not be able to control everything, but you can control how you react.  Adding humor to your life by watching funny films or TV shows.  Making time for activities that help you relax and not feeling guilty about spending your  time this way.  Medicines Your health care provider may suggest certain medicines if he or she feels that they will help improve your condition. Avoid using alcohol and other substances that may prevent your medicines from working properly (may interact). It is also important to:  Talk with your pharmacist or health care provider about all the medicines that you take, their possible side effects, and what medicines are safe to take together.  Make it your goal to take part in all treatment decisions (shared decision-making). This includes giving input on the side effects of medicines. It is best if shared decision-making with your health care provider is part of your total treatment plan. If your health care provider prescribes a medicine, you may not notice the full benefits of it for 4-8 weeks. Most people who are treated for depression need to be on medicine for at least 6-12 months after they feel better. If you are taking medicines as part of your treatment, do not stop taking medicines without first talking to your health care provider. You may need to have the medicine slowly decreased (tapered) over time to decrease the risk of harmful side effects. Relationships Your health care provider may suggest family therapy along with individual therapy and  drug therapy. While there may not be family problems that are causing you to feel depressed, it is still important to make sure your family learns as much as they can about your mental health. Having your family's support can help make your treatment successful. How to recognize changes in your condition Everyone has a different response to treatment for depression. Recovery from major depression happens when you have not had signs of major depression for two months. This may mean that you will start to:  Have more interest in doing activities.  Feel less hopeless than you did 2 months ago.  Have more energy.  Overeat less often, or have better or improving appetite.  Have better concentration. Your health care provider will work with you to decide the next steps in your recovery. It is also important to recognize when your condition is getting worse. Watch for these signs:  Having fatigue or low energy.  Eating too much or too little.  Sleeping too much or too little.  Feeling restless, agitated, or hopeless.  Having trouble concentrating or making decisions.  Having unexplained physical complaints.  Feeling irritable, angry, or aggressive. Get help as soon as you or your family members notice these symptoms coming back. How to get support and help from others How to talk with friends and family members about your condition  Talking to friends and family members about your condition can provide you with one way to get support and guidance. Reach out to trusted friends or family members, explain your symptoms to them, and let them know that you are working with a health care provider to treat your depression. Financial resources Not all insurance plans cover mental health care, so it is important to check with your insurance carrier. If paying for co-pays or counseling services is a problem, search for a local or county mental health care center. They may be able to offer public  mental health care services at low or no cost when you are not able to see a private health care provider. If you are taking medicine for depression, you may be able to get the generic form, which may be less expensive. Some makers of prescription medicines also offer help to patients who cannot afford  the medicines they need. Follow these instructions at home:   Get the right amount and quality of sleep.  Cut down on using caffeine, tobacco, alcohol, and other potentially harmful substances.  Try to exercise, such as walking or lifting small weights.  Take over-the-counter and prescription medicines only as told by your health care provider.  Eat a healthy diet that includes plenty of vegetables, fruits, whole grains, low-fat dairy products, and lean protein. Do not eat a lot of foods that are high in solid fats, added sugars, or salt.  Keep all follow-up visits as told by your health care provider. This is important. Contact a health care provider if:  You stop taking your antidepressant medicines, and you have any of these symptoms: ? Nausea. ? Headache. ? Feeling lightheaded. ? Chills and body aches. ? Not being able to sleep (insomnia).  You or your friends and family think your depression is getting worse. Get help right away if:  You have thoughts of hurting yourself or others. If you ever feel like you may hurt yourself or others, or have thoughts about taking your own life, get help right away. You can go to your nearest emergency department or call:  Your local emergency services (911 in the [place]  A suicide crisis helpline, such as the [place] at 8785979058. This is open 24-hours a day. Summary  If you are living with depression, there are ways to help you recover from it and also ways to prevent it from coming back.  Work with your health care team to create a management plan that includes counseling, stress management techniques,  and healthy lifestyle habits. This information is not intended to replace advice given to you by your health care provider. Make sure you discuss any questions you have with your health care provider. Document Revised: 10/04/2018 Document Reviewed: 05/15/2016 Elsevier Patient Education  [place].

## 2020-06-04 LAB — VITAMIN D 25 HYDROXY (VIT D DEFICIENCY, FRACTURES): Vit D, 25-Hydroxy: 34 ng/mL (ref 30–100)

## 2020-06-04 LAB — T4, FREE: Free T4: 1.2 ng/dL (ref 0.8–1.8)

## 2020-06-04 LAB — TSH: TSH: 0.76 mIU/L

## 2020-06-24 DIAGNOSIS — Z20818 Contact with and (suspected) exposure to other bacterial communicable diseases: Secondary | ICD-10-CM | POA: Diagnosis not present

## 2020-06-25 ENCOUNTER — Other Ambulatory Visit: Payer: Self-pay | Admitting: Family Medicine

## 2020-06-25 DIAGNOSIS — F321 Major depressive disorder, single episode, moderate: Secondary | ICD-10-CM

## 2020-06-25 DIAGNOSIS — F419 Anxiety disorder, unspecified: Secondary | ICD-10-CM

## 2020-06-28 ENCOUNTER — Encounter: Payer: Self-pay | Admitting: Family Medicine

## 2020-06-28 NOTE — Telephone Encounter (Signed)
Pt state that she is no longer taking this Rx reported on 06/28/2020

## 2020-07-01 ENCOUNTER — Encounter (INDEPENDENT_AMBULATORY_CARE_PROVIDER_SITE_OTHER): Payer: Self-pay

## 2020-07-01 DIAGNOSIS — Z01419 Encounter for gynecological examination (general) (routine) without abnormal findings: Secondary | ICD-10-CM | POA: Diagnosis not present

## 2020-07-01 DIAGNOSIS — Z130 Encounter for screening for diseases of the blood and blood-forming organs and certain disorders involving the immune mechanism: Secondary | ICD-10-CM | POA: Diagnosis not present

## 2020-07-01 DIAGNOSIS — Z124 Encounter for screening for malignant neoplasm of cervix: Secondary | ICD-10-CM | POA: Diagnosis not present

## 2020-07-01 DIAGNOSIS — Z1231 Encounter for screening mammogram for malignant neoplasm of breast: Secondary | ICD-10-CM | POA: Diagnosis not present

## 2020-07-01 DIAGNOSIS — Z1151 Encounter for screening for human papillomavirus (HPV): Secondary | ICD-10-CM | POA: Diagnosis not present

## 2020-07-03 DIAGNOSIS — F419 Anxiety disorder, unspecified: Secondary | ICD-10-CM | POA: Insufficient documentation

## 2020-07-03 DIAGNOSIS — F321 Major depressive disorder, single episode, moderate: Secondary | ICD-10-CM | POA: Insufficient documentation

## 2020-07-06 DIAGNOSIS — Z20822 Contact with and (suspected) exposure to covid-19: Secondary | ICD-10-CM | POA: Diagnosis not present

## 2020-07-29 ENCOUNTER — Encounter: Payer: Self-pay | Admitting: Family Medicine

## 2020-08-05 ENCOUNTER — Encounter: Payer: Self-pay | Admitting: Family Medicine

## 2020-08-05 ENCOUNTER — Ambulatory Visit (INDEPENDENT_AMBULATORY_CARE_PROVIDER_SITE_OTHER): Payer: BC Managed Care – PPO | Admitting: Family Medicine

## 2020-08-05 ENCOUNTER — Other Ambulatory Visit: Payer: Self-pay

## 2020-08-05 VITALS — BP 118/76 | HR 93 | Temp 98.1°F | Wt 195.0 lb

## 2020-08-05 DIAGNOSIS — F900 Attention-deficit hyperactivity disorder, predominantly inattentive type: Secondary | ICD-10-CM

## 2020-08-05 DIAGNOSIS — F419 Anxiety disorder, unspecified: Secondary | ICD-10-CM

## 2020-08-05 DIAGNOSIS — Z6833 Body mass index (BMI) 33.0-33.9, adult: Secondary | ICD-10-CM | POA: Diagnosis not present

## 2020-08-05 DIAGNOSIS — F32A Depression, unspecified: Secondary | ICD-10-CM

## 2020-08-05 DIAGNOSIS — E6609 Other obesity due to excess calories: Secondary | ICD-10-CM

## 2020-08-05 MED ORDER — AMPHETAMINE-DEXTROAMPHETAMINE 10 MG PO TABS: 10.0000 mg | ORAL_TABLET | Freq: Every day | ORAL | 0 refills | Status: DC

## 2020-08-05 NOTE — Patient Instructions (Signed)

## 2020-08-05 NOTE — Progress Notes (Signed)
Subjective:    Patient ID: Paige Wilkinson, female    DOB: 1975/08/01, 45 y.o.   MRN: [id number]  No chief complaint on file.   HPI Patient was seen today for f/u.  Pt notes increased inattention at work.  States her boss has noticed. Interested in restarting adderall at a lower dose.  In the past was on extended release.  Concerned about it causing insomnia.  Pt still considering counseling. Concerned about transportation as sharing a car.  Pt has restarted weight watchers.  Making her own meals.  Working on [employer].  States paid off a credit card and not buying anything at the moment.  Pt's friend died recently.  Trying to work on stress.  Pt got a new pit bull puppy which is helping her stay active.  Patient stopped taking Zoloft 25 mg and it caused fatigue.  Past Medical History:  Diagnosis Date  . Depression   . History of chicken pox     No Known Allergies  ROS General: Denies fever, chills, night sweats, changes in appetite +changes in weight HEENT: Denies headaches, ear pain, changes in vision, rhinorrhea, sore throat CV: Denies CP, palpitations, SOB, orthopnea Pulm: Denies SOB, cough, wheezing GI: Denies abdominal pain, nausea, vomiting, diarrhea, constipation GU: Denies dysuria, hematuria, frequency, vaginal discharge Msk: Denies muscle cramps, joint pains Neuro: Denies weakness, numbness, tingling Skin: Denies rashes, bruising Psych: Denies depression, anxiety, hallucinations  +inattention, stress    Objective:    Blood pressure 118/76, pulse 93, temperature 98.1 F (36.7 C), temperature source Oral, weight 195 lb (88.5 kg), SpO2 98 %.  Gen. Pleasant, well-nourished, in no distress, normal affect   HEENT: [place]/AT, face symmetric, conjunctiva clear, no scleral icterus, PERRLA, EOMI, nares patent without drainage Lungs: no accessory muscle use Cardiovascular: RRR,  no peripheral edema Musculoskeletal: No deformities, no cyanosis or clubbing, normal tone Neuro:  A&Ox3, CN  II-XII intact, normal gait Skin:  Warm, no lesions/ rash   Wt Readings from Last 3 Encounters:  08/05/20 195 lb (88.5 kg)  06/03/20 187 lb 12.8 oz (85.2 kg)  05/19/20 186 lb (84.4 kg)    Lab Results  Component Value Date   WBC 8.5 01/05/2020   HGB 13.0 01/05/2020   HCT 40.4 01/05/2020   PLT 351 01/05/2020   GLUCOSE 92 01/05/2020   CHOL 141 03/12/2019   TRIG 67.0 03/12/2019   HDL 57.40 03/12/2019   LDLCALC 71 03/12/2019   ALT 10 01/05/2020   AST 18 01/05/2020   NA 136 01/05/2020   K 4.3 01/05/2020   CL 102 01/05/2020   CREATININE 0.87 01/05/2020   BUN 17 01/05/2020   CO2 25 01/05/2020   TSH 0.76 06/03/2020   HGBA1C 5.2 01/05/2020    Assessment/Plan:  Attention deficit hyperactivity disorder (ADHD), predominantly inattentive type  -will restart Adderall.  Previously stopped after pt's syncopal episode -Adderall immediate release tabs 10 mg daily -consider counseling and formal testing -PMDP reviewed - Plan: amphetamine-dextroamphetamine (ADDERALL) 10 MG tablet  Class 1 obesity due to excess calories without serious comorbidity with body mass index (BMI) of 33.0 to 33.9 in adult -continue weight watchers -continue lifestyle modifications -will continue to monitor  Anxiety and depression -improving -PHQ 9 score 3 -GAD-7 score 2 -d/c zoloft 25 mg 2/2 fatigue -Patient to consider counseling  F/u in 1 month  Abbe Amsterdam, MD

## 2020-08-23 DIAGNOSIS — N920 Excessive and frequent menstruation with regular cycle: Secondary | ICD-10-CM | POA: Diagnosis not present

## 2020-09-02 ENCOUNTER — Ambulatory Visit (INDEPENDENT_AMBULATORY_CARE_PROVIDER_SITE_OTHER): Payer: BC Managed Care – PPO | Admitting: Family Medicine

## 2020-09-02 ENCOUNTER — Other Ambulatory Visit: Payer: Self-pay

## 2020-09-02 ENCOUNTER — Encounter: Payer: Self-pay | Admitting: Family Medicine

## 2020-09-02 VITALS — BP 98/60 | HR 80 | Temp 98.9°F | Ht 64.0 in | Wt 186.8 lb

## 2020-09-02 DIAGNOSIS — F900 Attention-deficit hyperactivity disorder, predominantly inattentive type: Secondary | ICD-10-CM | POA: Diagnosis not present

## 2020-09-02 DIAGNOSIS — E6609 Other obesity due to excess calories: Secondary | ICD-10-CM | POA: Diagnosis not present

## 2020-09-02 DIAGNOSIS — Z6832 Body mass index (BMI) 32.0-32.9, adult: Secondary | ICD-10-CM

## 2020-09-02 MED ORDER — AMPHETAMINE-DEXTROAMPHETAMINE 10 MG PO TABS: 10.0000 mg | ORAL_TABLET | Freq: Every day | ORAL | 0 refills | Status: AC

## 2020-09-02 MED ORDER — AMPHETAMINE-DEXTROAMPHETAMINE 10 MG PO TABS: 10.0000 mg | ORAL_TABLET | Freq: Every day | ORAL | 0 refills | Status: DC

## 2020-09-02 NOTE — Progress Notes (Signed)
Subjective:    Patient ID: Paige Wilkinson, female    DOB: 1975/11/24, 45 y.o.   MRN: [id number]  Chief Complaint  Patient presents with  . Follow-up    HPI Patient is a 45 year old female with past medical history significant for ADD anxiety, depression who was seen for f/u.  Pt doing well on Adderall 10 mg daily.  Pt previously on XR and higher dose which caused insomnia.  Med was d/c after an unrelated syncopal episode.  Notes a big improvement in ability to focus at work. Denies difficulty sleeping or decreased appetite.  Pt losing weight on weight watchers.  Finds going to the meetings helpful.  Was 184 lbs at last weigh in at [employer].  Pt notes some improvement in her stress, though dealing with the unexpected death of another family member.  Trying to think about the positive things that happen.   Previously on Zoloft 25 mg however it was d/c'd 2/2 causing fatigue.   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    Past Medical History:  Diagnosis Date  . Depression   . History of chicken pox     No Known Allergies  ROS General: Denies fever, chills, night sweats, changes in weight, changes in appetite HEENT: Denies headaches, ear pain, changes in vision, rhinorrhea, sore throat CV: Denies CP, palpitations, SOB, orthopnea Pulm: Denies SOB, cough, wheezing GI: Denies abdominal pain, nausea, vomiting, diarrhea, constipation GU: Denies dysuria, hematuria, frequency, vaginal discharge Msk: Denies muscle cramps, joint pains Neuro: Denies weakness, numbness, tingling Skin: Denies rashes, bruising Psych: Denies depression, anxiety, hallucinations       Objective:    Blood pressure 98/60, pulse 80, temperature 98.9 F (37.2 C), temperature source Oral, height 5\' 4"  (1.626 m), weight 186 lb 12.8 oz (84.7 kg), last menstrual period 08/19/2020, SpO2 96  %.  Gen. Pleasant, well-nourished, in no distress, normal affect   HEENT: [place]/AT, face symmetric, conjunctiva clear, no scleral icterus, PERRLA, EOMI, nares patent without drainage Lungs: no accessory muscle use Cardiovascular: RRR, no peripheral edema Musculoskeletal: No deformities, no cyanosis or clubbing, normal tone Neuro:  A&Ox3, CN II-XII intact, normal gait Skin:  Warm, no lesions/ rash   Wt Readings from Last 3 Encounters:  09/02/20 186 lb 12.8 oz (84.7 kg)  08/05/20 195 lb (88.5 kg)  06/03/20 187 lb 12.8 oz (85.2 kg)    Lab Results  Component Value Date   WBC 8.5 01/05/2020   HGB 13.0 01/05/2020   HCT 40.4 01/05/2020   PLT 351 01/05/2020   GLUCOSE 92 01/05/2020   CHOL 141 03/12/2019   TRIG 67.0 03/12/2019   HDL 57.40 03/12/2019   LDLCALC 71 03/12/2019   ALT 10 01/05/2020   AST 18 01/05/2020   NA 136 01/05/2020   K 4.3 01/05/2020   CL 102 01/05/2020   CREATININE 0.87 01/05/2020   BUN 17 01/05/2020   CO2 25 01/05/2020   TSH 0.76 06/03/2020   HGBA1C 5.2 01/05/2020    Assessment/Plan:  Attention deficit hyperactivity disorder (ADHD), predominantly inattentive type  -stable  -Continue current dose of Adderall 10 mg daily -PDMP reviewed - Plan: amphetamine-dextroamphetamine (ADDERALL) 10 MG tablet, amphetamine-dextroamphetamine (ADDERALL) 10 MG tablet  Class I obesity due to excess calories with BMI of 32-32.9 in adult, unspecified whether serious comorbidity present -Improving -Patient congratulated on weight loss and lifestyle modifications -Continue weight watchers  F/u prn if unable to est with new provider.  Given records release form to have records sent.  Abbe Amsterdam, MD

## 2020-09-13 DIAGNOSIS — Z30430 Encounter for insertion of intrauterine contraceptive device: Secondary | ICD-10-CM | POA: Diagnosis not present

## 2020-10-01 DIAGNOSIS — Z0000 Encounter for general adult medical examination without abnormal findings: Secondary | ICD-10-CM | POA: Diagnosis not present

## 2020-10-01 DIAGNOSIS — Z1339 Encounter for screening examination for other mental health and behavioral disorders: Secondary | ICD-10-CM | POA: Diagnosis not present

## 2020-10-01 DIAGNOSIS — Z1331 Encounter for screening for depression: Secondary | ICD-10-CM | POA: Diagnosis not present

## 2020-11-16 NOTE — Progress Notes (Deleted)
NEUROLOGY FOLLOW UP OFFICE NOTE  Paige Wilkinson [id number]  Assessment/Plan:   Syncope  1.  ***  Subjective:  Paige Wilkinson is a 77 year oldright-handed female with ADD who follows up for syncope.  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  Awake and asleep EEG on 05/27/2020 was normal.    HISTORY: On 11/15/2019, she was at a family picnic when she passed out. No preceding warning such as lightheadedness. She was standing and talking and then next minute she was on the ground. She says that her husband told her that she was "seizing". She was unconscious for maybe 3 to 4 minutes. No tongue biting or incontinence. She had a bruise on the right side of her face from hitting the pavement. When she woke up that morning, she said she didn't feel right. She was outside in the heat for only a short while. She did not skip any meals and was keeping hydrated. She had only 1 alcoholic beverage. EMS was called and brought her to the ED at [facility].Said possible dehydration.CT head, CXR and labs were normal. She later followed up with her PCP in July. CBC revealed no anemia. CMP was normal. Hgb A1c was 5.2. TSH was 0.63. UDS was negative. Prior to this event, she reports blurred vision. She has not had an eye exam yet as she has no insurance which won't start until October. Denies palpitations. She reports some memory problems which she attributes to being off of her ADD medication.   She reports history of mild concussion as a child. No history of seizures. No family history of seizures.  PAST MEDICAL HISTORY: Past Medical History:  Diagnosis Date  . Depression   . History of chicken pox     MEDICATIONS: Current Outpatient Medications on File Prior to Visit  Medication Sig Dispense Refill   . acetaminophen (TYLENOL) 500 MG tablet Take 1 tablet (500 mg total) by mouth every 6 (six) hours as needed. 30 tablet 0  . amphetamine-dextroamphetamine (ADDERALL) 10 MG tablet Take 1 tablet (10 mg total) by mouth daily with breakfast. 30 tablet 0  . amphetamine-dextroamphetamine (ADDERALL) 10 MG tablet Take 1 tablet (10 mg total) by mouth daily with breakfast. 30 tablet 0  . Ascorbic Acid (VITAMIN C) 1000 MG tablet Take 1,000 mg by mouth daily.    . cyanocobalamin 500 MCG tablet Take 500 mcg by mouth daily.    . fluticasone (FLONASE) 50 MCG/ACT nasal spray Place 1 spray into both nostrils daily. 16 g 4  . Omega-3 Fatty Acids (FISH OIL) 1000 MG CAPS Take by mouth daily.     No current facility-administered medications on file prior to visit.    ALLERGIES: No Known Allergies  FAMILY HISTORY: No family history on file.    Objective:  *** General: No acute distress.  Patient appears ***-groomed.   Head:  Normocephalic/atraumatic Eyes:  Fundi examined but not visualized Neck: supple, no paraspinal tenderness, full range of motion Heart:  Regular rate and rhythm Lungs:  Clear to auscultation bilaterally Back: No paraspinal tenderness Neurological Exam: alert and oriented to person, place, and time. Attention span and concentration intact, recent and remote memory intact, fund of knowledge intact.  Speech fluent and not dysarthric, language intact.  CN II-XII intact. Bulk and tone normal, muscle strength 5/5 throughout.  Sensation to light touch, temperature and  vibration intact.  Deep tendon reflexes 2+ throughout, toes downgoing.  Finger to nose and heel to shin testing intact.  Gait normal, Romberg negative.     Shon Millet, DO  CC: ***

## 2020-11-17 ENCOUNTER — Ambulatory Visit: Payer: BC Managed Care – PPO | Admitting: Neurology

## 2020-12-16 DIAGNOSIS — Z30431 Encounter for routine checking of intrauterine contraceptive device: Secondary | ICD-10-CM | POA: Diagnosis not present

## 2021-02-16 DIAGNOSIS — O352XX0 Maternal care for (suspected) hereditary disease in fetus, not applicable or unspecified: Secondary | ICD-10-CM | POA: Diagnosis not present

## 2021-02-16 DIAGNOSIS — Z315 Encounter for genetic counseling: Secondary | ICD-10-CM | POA: Diagnosis not present

## 2021-03-26 DIAGNOSIS — Z23 Encounter for immunization: Secondary | ICD-10-CM | POA: Diagnosis not present

## 2021-04-04 DIAGNOSIS — J0100 Acute maxillary sinusitis, unspecified: Secondary | ICD-10-CM | POA: Diagnosis not present

## 2021-04-04 DIAGNOSIS — J029 Acute pharyngitis, unspecified: Secondary | ICD-10-CM | POA: Diagnosis not present

## 2021-04-04 DIAGNOSIS — R110 Nausea: Secondary | ICD-10-CM | POA: Diagnosis not present

## 2021-07-06 DIAGNOSIS — Z1389 Encounter for screening for other disorder: Secondary | ICD-10-CM | POA: Diagnosis not present

## 2021-07-06 DIAGNOSIS — R208 Other disturbances of skin sensation: Secondary | ICD-10-CM | POA: Diagnosis not present

## 2021-07-06 DIAGNOSIS — Z1231 Encounter for screening mammogram for malignant neoplasm of breast: Secondary | ICD-10-CM | POA: Diagnosis not present

## 2021-07-06 DIAGNOSIS — Z6831 Body mass index (BMI) 31.0-31.9, adult: Secondary | ICD-10-CM | POA: Diagnosis not present

## 2021-07-06 DIAGNOSIS — Z01419 Encounter for gynecological examination (general) (routine) without abnormal findings: Secondary | ICD-10-CM | POA: Diagnosis not present

## 2021-07-06 DIAGNOSIS — Z130 Encounter for screening for diseases of the blood and blood-forming organs and certain disorders involving the immune mechanism: Secondary | ICD-10-CM | POA: Diagnosis not present

## 2021-07-21 DIAGNOSIS — R202 Paresthesia of skin: Secondary | ICD-10-CM | POA: Diagnosis not present

## 2022-01-01 IMAGING — MR MR HEAD WO/W CM
15 series · 48 of 48 positions shown · IV contrast (multihance)
Comparison: None.

CLINICAL DATA: Syncope, recurrent.

EXAM:
MRI HEAD WITHOUT AND WITH CONTRAST
TECHNIQUE: Multiplanar, multiecho pulse sequences of the brain and surrounding
structures were obtained without and with intravenous contrast.
CONTRAST:  17mL MULTIHANCE GADOBENATE DIMEGLUMINE 529 MG/ML IV SOLN

[Series 5: T1 · sagittal · 4.0mm · 0.75mm/px · 1 of 31 slices shown (1 of 3)]
[im 1/31]
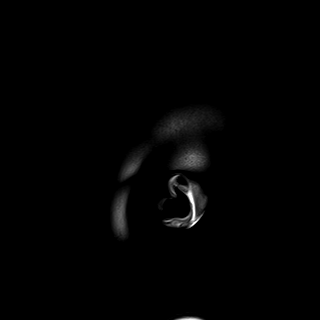

[Series 6: DWI · axial · 3.0mm · 1.44mm/px · z∈[-75,+66]mm · 5 of 88 slices shown (1 of 4)]
[im 1/88]
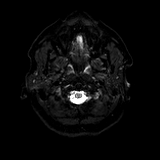
[im 22/88]
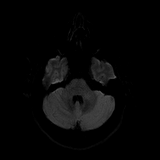
[im 44/88]
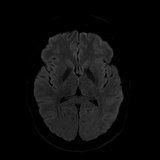
[im 66/88]
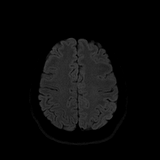
[im 88/88]
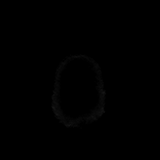

[Series 7: DWI · axial · 3.0mm · 1.44mm/px · z∈[-75,+66]mm · 2 of 41 slices shown (2 of 4)]
[im 1/41]
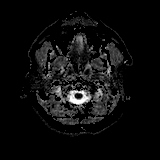
[im 41/41]
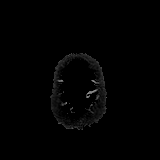

[Series 8: DWI · coronal · 5.0mm · 1.44mm/px · 3 of 62 slices shown (3 of 4)]
[im 1/62]
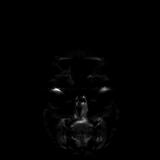
[im 31/62]
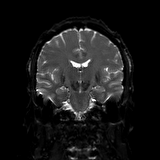
[im 62/62]
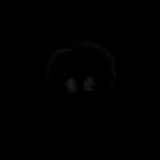

[Series 9: DWI · coronal · 5.0mm · 1.44mm/px · 2 of 31 slices shown (4 of 4)]
[im 1/31]
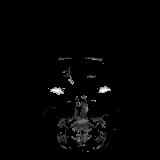
[im 31/31]
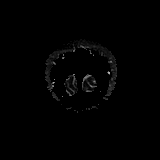

[Series 10: T2 · axial · 4.0mm · 0.36mm/px · 1 of 28 slices shown (1 of 2)]
[im 1/28]
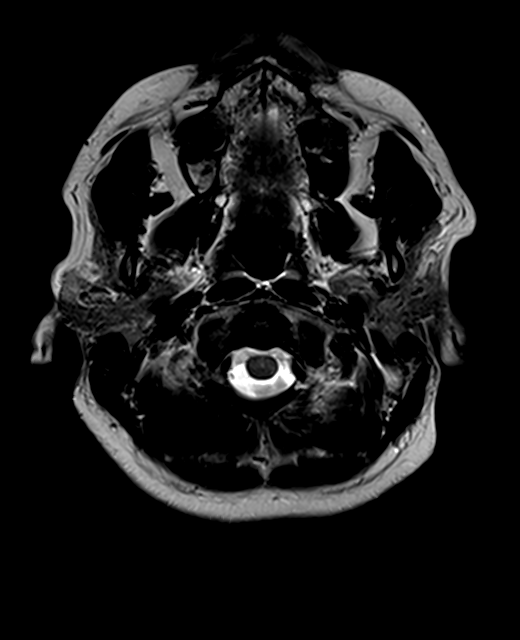

[Series 11: FLAIR · axial · 3.0mm · 0.72mm/px · 1 of 26 slices shown (1 of 2)]
[im 1/26]
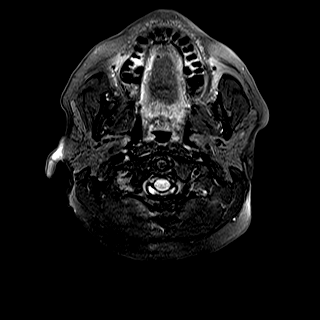

[Series 12: mip_images(sw) · axial · 12.0mm · 0.90mm/px · z∈[-73,+58]mm · 5 of 89 slices shown]
[im 1/89]
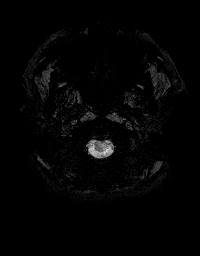
[im 23/89]
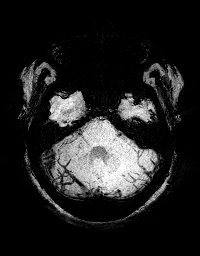
[im 45/89]
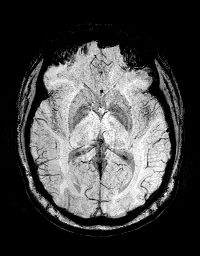
[im 67/89]
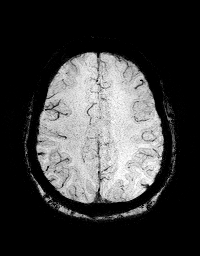
[im 89/89]
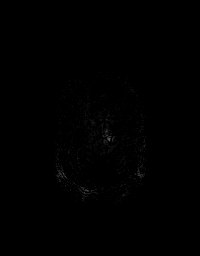

[Series 14: T1 · axial · 1.0mm · 0.94mm/px · z∈[-98,+60]mm · 9 of 160 slices shown (2 of 3)]
[im 1/160]
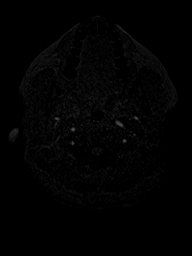
[im 20/160]
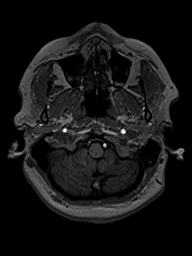
[im 40/160]
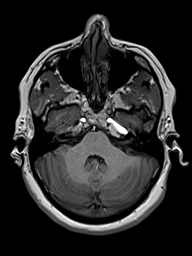
[im 60/160]
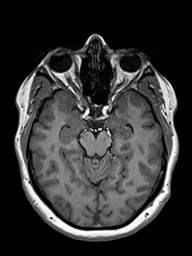
[im 80/160]
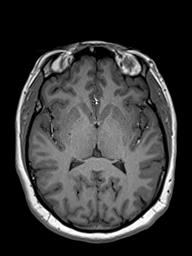
[im 100/160]
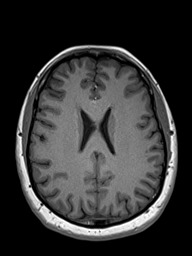
[im 120/160]
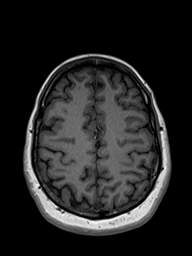
[im 140/160]
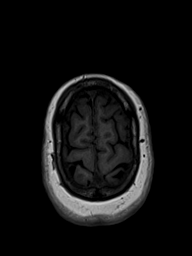
[im 160/160]
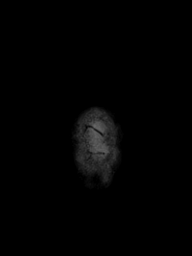

[Series 15: T2 · coronal · 3.0mm · 0.47mm/px · 1 of 25 slices shown (2 of 2)]
[im 1/25]
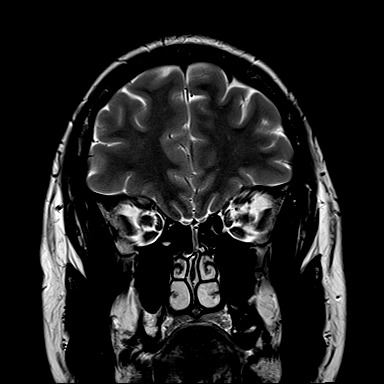

[Series 16: FLAIR · coronal · 3.0mm · 0.56mm/px · 2 of 30 slices shown (2 of 2)]
[im 1/30]
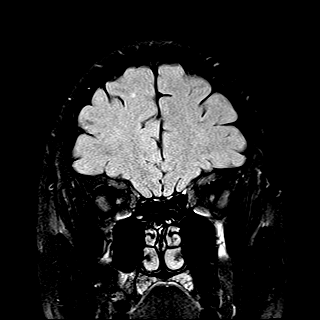
[im 30/30]
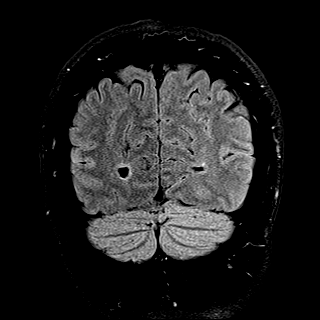

[Series 17: T2 post-contrast · coronal · 4.0mm · 0.36mm/px · 2 of 33 slices shown]
[im 1/33]
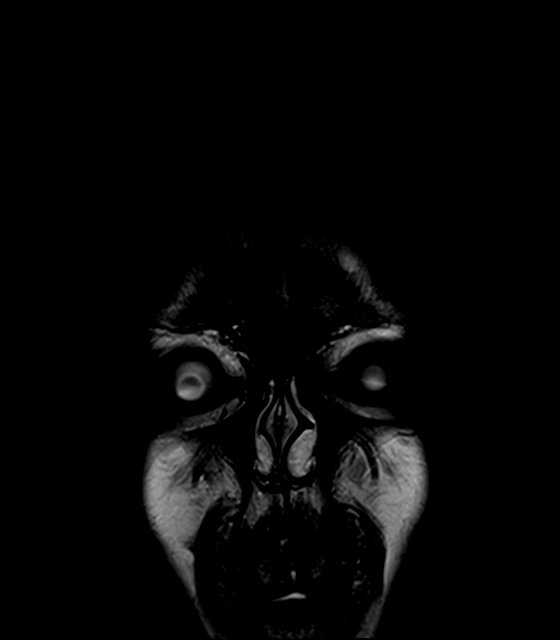
[im 33/33]
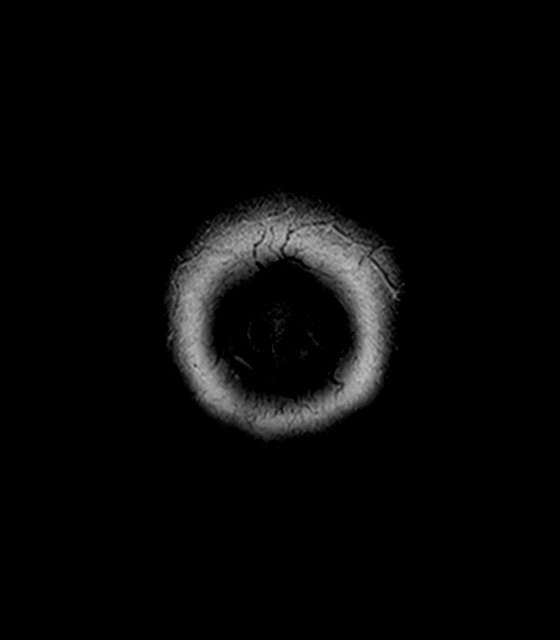

[Series 18: T1 · axial · 1.0mm · 0.94mm/px · z∈[-98,+60]mm · 9 of 160 slices shown (3 of 3)]
[im 1/160]
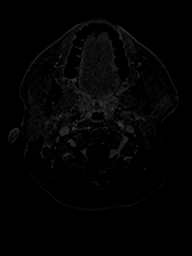
[im 20/160]
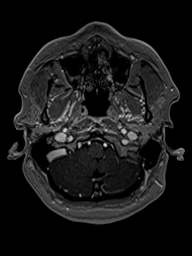
[im 40/160]
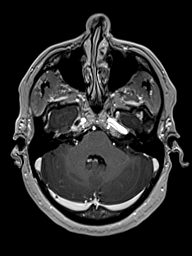
[im 60/160]
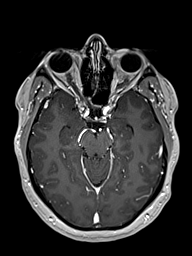
[im 80/160]
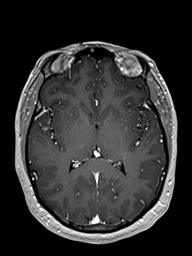
[im 100/160]
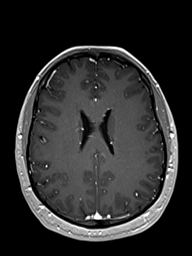
[im 120/160]
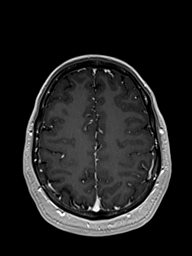
[im 140/160]
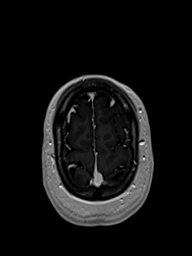
[im 160/160]
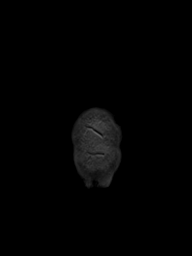

[Series 19: T1 post-contrast · coronal · 4.0mm · 0.72mm/px · 2 of 33 slices shown]
[im 1/33]
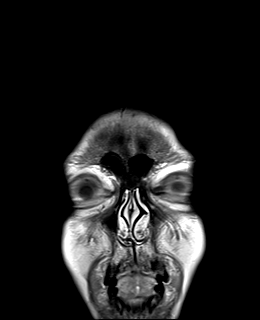
[im 33/33]
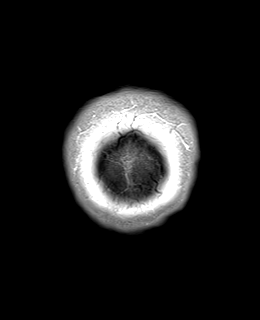

[Series 100: coronal · coronal · 1.0mm · 0.94mm/px · 3 of 61 slices shown]
[im 1/61]
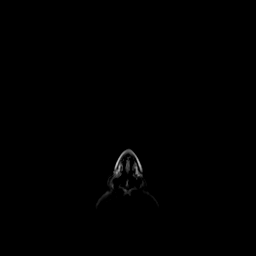
[im 31/61]
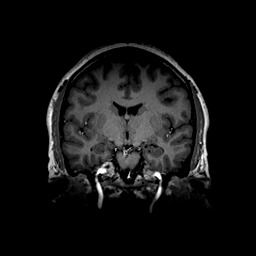
[im 61/61]
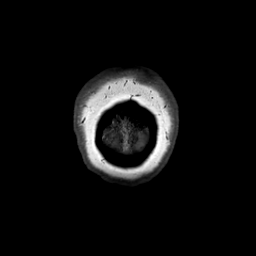

[48 of 48 positions shown; findings below may reference images not displayed]

FINDINGS: Brain: No acute infarction, hemorrhage, hydrocephalus, extra-axial
collection or mass lesion. Few punctate foci of T2 hyperintensity
are seen within the white matter of the cerebral hemispheres,
nonspecific. Tiny choroidal fissure cyst on the left measuring 8 mm
in length x 1-2 mm in thickness (series 14, image 67). Otherwise,
the brain parenchyma has normal morphology and signal
characteristics. No focus of abnormal contrast enhancement.

Vascular: Normal flow voids.

Skull and upper cervical spine: Normal marrow signal.

Sinuses/Orbits: Negative.

Other: None.
IMPRESSION: 1. No acute intracranial abnormality or abnormal contrast
enhancement.
2. Few punctate foci of T2 hyperintensity within the white matter of
the cerebral hemispheres, nonspecific. Differential considerations
include migraine headaches, early chronic microvascular ischemic
changes, demyelinating disease or sequelae of prior
infectious/inflammatory process.
3. Tiny choroidal fissure cyst on the left measuring 1-2 mm in
thickness.

## 2023-02-23 DIAGNOSIS — Z79899 Other long term (current) drug therapy: Secondary | ICD-10-CM | POA: Diagnosis not present

## 2023-02-27 DIAGNOSIS — R39198 Other difficulties with micturition: Secondary | ICD-10-CM | POA: Diagnosis not present

## 2023-03-02 DIAGNOSIS — F900 Attention-deficit hyperactivity disorder, predominantly inattentive type: Secondary | ICD-10-CM | POA: Diagnosis not present

## 2023-03-02 DIAGNOSIS — Z1331 Encounter for screening for depression: Secondary | ICD-10-CM | POA: Diagnosis not present

## 2023-03-02 DIAGNOSIS — Z23 Encounter for immunization: Secondary | ICD-10-CM | POA: Diagnosis not present

## 2023-03-02 DIAGNOSIS — Z0000 Encounter for general adult medical examination without abnormal findings: Secondary | ICD-10-CM | POA: Diagnosis not present

## 2023-03-02 DIAGNOSIS — Z1339 Encounter for screening examination for other mental health and behavioral disorders: Secondary | ICD-10-CM | POA: Diagnosis not present

## 2023-03-02 DIAGNOSIS — N951 Menopausal and female climacteric states: Secondary | ICD-10-CM | POA: Diagnosis not present

## 2023-03-19 DIAGNOSIS — T7840XA Allergy, unspecified, initial encounter: Secondary | ICD-10-CM | POA: Diagnosis not present

## 2023-04-03 ENCOUNTER — Other Ambulatory Visit: Payer: Self-pay | Admitting: Family

## 2023-04-03 DIAGNOSIS — R1013 Epigastric pain: Secondary | ICD-10-CM

## 2023-04-05 ENCOUNTER — Ambulatory Visit
Admission: RE | Admit: 2023-04-05 | Discharge: 2023-04-05 | Disposition: A | Discharge status: Home / Self Care | Payer: BC Managed Care – PPO | Source: Ambulatory Visit | Attending: Family | Admitting: Family

## 2023-04-05 DIAGNOSIS — R1013 Epigastric pain: Secondary | ICD-10-CM | POA: Diagnosis not present

## 2023-05-11 ENCOUNTER — Ambulatory Visit (AMBULATORY_SURGERY_CENTER): Payer: BC Managed Care – PPO

## 2023-05-11 VITALS — Ht 64.0 in | Wt 182.0 lb

## 2023-05-11 DIAGNOSIS — Z1211 Encounter for screening for malignant neoplasm of colon: Secondary | ICD-10-CM

## 2023-05-11 MED ORDER — NA SULFATE-K SULFATE-MG SULF 17.5-3.13-1.6 GM/177ML PO SOLN: 1.0000 | Freq: Once | ORAL | 0 refills | Status: AC

## 2023-05-11 NOTE — Progress Notes (Signed)
No egg or soy allergy known to patient  No issues known to pt with past sedation with any surgeries or procedures Patient denies ever being told they had issues or difficulty with intubation  No FH of Malignant Hyperthermia Pt is not on diet pills Pt is not on  home 02  Pt is not on blood thinners  Pt has issues with constipation modifies diet to resolve No A fib or A flutter Have any cardiac testing pending--no Pt can ambulate independently Pt denies use of chewing tobacco Discussed diabetic I weight loss medication holds Discussed NSAID holds Checked BMI Pt instructed to use Single[employer]com or GoodRx for a price reduction on prep  Patient's chart reviewed by Cathlyn Parsons CNRA prior to previsit and patient appropriate for the [facility].  Pre visit completed and red dot placed by patient's name on their procedure day (on provider's schedule).

## 2023-05-21 ENCOUNTER — Encounter: Payer: Self-pay | Admitting: Gastroenterology

## 2023-05-31 ENCOUNTER — Encounter: Payer: BLUE CROSS/BLUE SHIELD | Admitting: Gastroenterology

## 2023-06-13 ENCOUNTER — Other Ambulatory Visit: Payer: Self-pay | Admitting: Medical Genetics

## 2023-07-17 DIAGNOSIS — Z1231 Encounter for screening mammogram for malignant neoplasm of breast: Secondary | ICD-10-CM | POA: Diagnosis not present

## 2023-07-18 ENCOUNTER — Other Ambulatory Visit (HOSPITAL_COMMUNITY): Payer: Self-pay | Attending: Medical Genetics

## 2023-08-13 DIAGNOSIS — R6883 Chills (without fever): Secondary | ICD-10-CM | POA: Diagnosis not present

## 2023-08-13 DIAGNOSIS — B349 Viral infection, unspecified: Secondary | ICD-10-CM | POA: Diagnosis not present

## 2023-10-05 ENCOUNTER — Encounter (HOSPITAL_COMMUNITY): Payer: Self-pay | Admitting: Emergency Medicine

## 2023-10-05 ENCOUNTER — Emergency Department (HOSPITAL_COMMUNITY)
Admission: EM | Admit: 2023-10-05 | Discharge: 2023-10-05 | Disposition: A | Discharge status: Home / Self Care | Attending: Emergency Medicine | Admitting: Emergency Medicine

## 2023-10-05 ENCOUNTER — Other Ambulatory Visit: Payer: Self-pay

## 2023-10-05 DIAGNOSIS — R21 Rash and other nonspecific skin eruption: Secondary | ICD-10-CM | POA: Diagnosis not present

## 2023-10-05 DIAGNOSIS — L509 Urticaria, unspecified: Secondary | ICD-10-CM | POA: Diagnosis not present

## 2023-10-05 DIAGNOSIS — Z79899 Other long term (current) drug therapy: Secondary | ICD-10-CM | POA: Diagnosis not present

## 2023-10-05 DIAGNOSIS — T7840XA Allergy, unspecified, initial encounter: Secondary | ICD-10-CM | POA: Diagnosis not present

## 2023-10-05 DIAGNOSIS — T782XXA Anaphylactic shock, unspecified, initial encounter: Secondary | ICD-10-CM | POA: Diagnosis not present

## 2023-10-05 DIAGNOSIS — L299 Pruritus, unspecified: Secondary | ICD-10-CM | POA: Diagnosis not present

## 2023-10-05 MED ORDER — FAMOTIDINE 20 MG PO TABS: 20.0000 mg | ORAL_TABLET | Freq: Two times a day (BID) | ORAL | 0 refills | Status: DC

## 2023-10-05 MED ORDER — DEXAMETHASONE SODIUM PHOSPHATE 10 MG/ML IJ SOLN
10.0000 mg | Freq: Once | INTRAMUSCULAR | Status: AC
  Administered 2023-10-05: 10 mg via INTRAVENOUS
  Filled 2023-10-05: qty 1

## 2023-10-05 MED ORDER — PREDNISONE 20 MG PO TABS: ORAL_TABLET | ORAL | 0 refills | Status: DC

## 2023-10-05 MED ORDER — DIPHENHYDRAMINE HCL 50 MG/ML IJ SOLN
25.0000 mg | Freq: Once | INTRAMUSCULAR | Status: AC
  Administered 2023-10-05: 25 mg via INTRAVENOUS
  Filled 2023-10-05: qty 1

## 2023-10-05 MED ORDER — FAMOTIDINE IN NACL 20-0.9 MG/50ML-% IV SOLN
20.0000 mg | Freq: Once | INTRAVENOUS | Status: AC
  Administered 2023-10-05: 20 mg via INTRAVENOUS
  Filled 2023-10-05: qty 50

## 2023-10-05 NOTE — ED Provider Notes (Signed)
 Paige Wilkinson EMERGENCY DEPARTMENT AT [facility] Provider Note   CSN: [id number] Arrival date & time: 10/05/23  0458     History  Chief Complaint  Patient presents with   Allergic Reaction    Paige Wilkinson is a 48 y.o. female.  The history is provided by the patient.  Allergic Reaction Presenting symptoms: rash   Presenting symptoms: no wheezing   Severity:  Moderate Duration: hours. Prior allergic episodes:  No prior episodes Context: medications   Context comment:  Ibuprofen. No cosmetics, no new detergents or foods Relieved by:  Nothing Worsened by:  Nothing Ineffective treatments:  None tried      Home Medications Prior to Admission medications   Medication Sig Start Date End Date Taking? Authorizing Provider  amphetamine-dextroamphetamine (ADDERALL) 10 MG tablet Take 1 tablet (10 mg total) by mouth daily with breakfast. 09/02/20  Yes Deeann Saint, MD  Biotin 08657 MCG TBDP Take 10,000 mcg by mouth daily.   Yes [provider]  famotidine (PEPCID) 20 MG tablet Take 1 tablet (20 mg total) by mouth 2 (two) times daily. 10/05/23  Yes Thad Osoria, MD  levonorgestrel (MIRENA, 52 MG,) 20 MCG/DAY IUD Take by intrauterine route. 09/13/20  Yes [provider]  predniSONE (DELTASONE) 20 MG tablet 3 tabs po day one, then 2 po daily x 4 days 10/05/23  Yes Yee Gangi, MD      Allergies    Patient has no known allergies.    Review of Systems   Review of Systems  Respiratory:  Negative for wheezing.   Skin:  Positive for rash.    Physical Exam Updated Vital Signs BP 128/77 (BP Location: Right Arm)   Pulse 71   Temp 97.8 F (36.6 C) (Oral) Comment: Simultaneous filing. User may not have seen previous data.  Resp 14   Ht 5\' 4"  (1.626 m)   Wt 79.8 kg   SpO2 100%   BMI 30.21 kg/m  Physical Exam Vitals and nursing note reviewed.  Constitutional:      General: She is not in acute distress.    Appearance: Normal appearance. She is  well-developed.  HENT:     Head: Normocephalic and atraumatic.     Nose: Nose normal.     Mouth/Throat:     Comments: No swelling of the lips tongue or uvula  Eyes:     Pupils: Pupils are equal, round, and reactive to light.  Cardiovascular:     Rate and Rhythm: Normal rate and regular rhythm.  Pulmonary:     Effort: No respiratory distress.     Breath sounds: Normal breath sounds.  Abdominal:     General: Bowel sounds are normal. There is no distension.     Palpations: Abdomen is soft.     Tenderness: There is no abdominal tenderness. There is no guarding or rebound.  Genitourinary:    Vagina: No vaginal discharge.  Musculoskeletal:        General: Normal range of motion.     Cervical back: Normal range of motion and neck supple.  Skin:    Capillary Refill: Capillary refill takes less than 2 seconds.     Findings: Rash present. No erythema.     Comments: Face urticaria   Neurological:     General: No focal deficit present.     Mental Status: She is alert and oriented to person, place, and time.  Psychiatric:        Mood and Affect: Mood normal.  ED Results / Procedures / Treatments   Labs (all labs ordered are listed, but only abnormal results are displayed) Labs Reviewed - No data to display  EKG None  Radiology No results found.  Procedures Procedures    Medications Ordered in ED Medications  famotidine (PEPCID) IVPB 20 mg premix (20 mg Intravenous New Bag/Given 10/05/23 0525)  diphenhydrAMINE (BENADRYL) injection 25 mg (25 mg Intravenous Given 10/05/23 0515)  dexamethasone (DECADRON) injection 10 mg (10 mg Intravenous Given 10/05/23 4034)    ED Course/ Medical Decision Making/ A&P                                 Medical Decision Making Patient with allergic reaction   Amount and/or Complexity of Data Reviewed External Data Reviewed: notes.    Details: Previous notes reviewed   Risk Prescription drug management. Risk Details: Symptoms resolved  with treatment.  Will start steroids for 5 days and pepcid twice daily for anti-histaminergic effect. Zyrtec twice daily for 7 days.  No ibuprofen.  Will have patient follow with allergy.  Stable for discharge     Final Clinical Impression(s) / ED Diagnoses Final diagnoses:  Allergic reaction, initial encounter   No signs of systemic illness or infection. The patient is nontoxic-appearing on exam and vital signs are within normal limits.  I have reviewed the triage vital signs and the nursing notes. Pertinent labs & imaging results that were available during my care of the patient were reviewed by me and considered in my medical decision making (see chart for details). After history, exam, and medical workup I feel the patient has been appropriately medically screened and is safe for discharge home. Pertinent diagnoses were discussed with the patient. Patient was given return precaution   Rx / DC Orders ED Discharge Orders          Ordered    predniSONE (DELTASONE) 20 MG tablet        10/05/23 0618    famotidine (PEPCID) 20 MG tablet  2 times daily        10/05/23 0618              Meloni Hinz, MD 10/05/23 [phone]

## 2023-10-05 NOTE — Discharge Instructions (Addendum)
 Zyrtec twice dail for 7 days

## 2023-10-05 NOTE — ED Triage Notes (Addendum)
 Pt BIB by EMS from home. Pt reports waking up this morning with swelling to face and rash to upper body. Pt unsure of allergen exposure. No acute distress noted on arrival to ED. Pt able to speak in full sentences. Redness noted to face. Pt reports taking 50 mg of Benadryl prior to EMS arrival.  EMS VS: BP 130/80 HR 94 98% RA

## 2023-10-11 DIAGNOSIS — J309 Allergic rhinitis, unspecified: Secondary | ICD-10-CM | POA: Diagnosis not present

## 2024-01-22 ENCOUNTER — Other Ambulatory Visit: Payer: Self-pay | Admitting: General Practice

## 2024-01-22 DIAGNOSIS — N6324 Unspecified lump in the left breast, lower inner quadrant: Secondary | ICD-10-CM

## 2024-02-04 DIAGNOSIS — R3911 Hesitancy of micturition: Secondary | ICD-10-CM | POA: Diagnosis not present

## 2024-02-04 DIAGNOSIS — R338 Other retention of urine: Secondary | ICD-10-CM | POA: Diagnosis not present

## 2024-02-04 DIAGNOSIS — R109 Unspecified abdominal pain: Secondary | ICD-10-CM | POA: Diagnosis not present

## 2024-02-04 NOTE — Progress Notes (Signed)
 Subjective Chief Complaint  Patient presents with  . Urinary Symptoms    Pt experiencing pain/pressure during urinating and frequency since last Tuesday. Only took Tylenol .  . Back Pain    Back pain.    HPI:The patient is a 48 year old female presenting with worsening left flank and lower abdominal pain for a little over one week. Pain is localized to the left flank and radiates to the mid-back. She describes the pain as pressure-like, worsened by sudden movements. She denies hematuria but notes that her urinalysis today is positive for leukocytes. She reports nausea without vomiting, no fevers, and no chills. Last menstrual period was in June; she has an IUD in place. She has no concerns for STI and her last sexual encounter was 6 months ago. She has no history of kidney stones, gout, dehydration, or family history of stones. The only medication she takes is Adderall 10 mg daily.   Documentation Reviewed by Any User:    Current Outpatient Medications  Medication Sig Dispense Refill  . EPINEPHrine (EPIPEN) 0.3 mg/0.3 mL pen injector Inject 0.3 mL into the muscle as needed for anaphylaxis 2 Each 0  . valACYclovir (VALTREX) 1 gram tablet Take 1 Tablet by mouth 3 (three) times daily 21 Tablet 0  . dextroamphetamine -amphetamine  (ADDERALL) 10 mg tablet Take 10 mg by mouth once daily     Review of Systems  Constitutional:  Negative for activity change, appetite change, chills, fatigue and fever.  Eyes:  Negative for photophobia and visual disturbance.  Respiratory:  Negative for cough, chest tightness, shortness of breath and wheezing.   Cardiovascular:  Negative for chest pain, palpitations and leg swelling.  Gastrointestinal:  Positive for nausea. Negative for abdominal distention, abdominal pain, constipation, diarrhea, vomiting and indigestion.  Endocrine: Negative for polydipsia, polyphagia and polyuria.  Genitourinary:  Positive for flank pain, frequency and pelvic pain. Negative for  difficulty urinating, dyspareunia, dysuria, hematuria, menstrual problem, vaginal discharge and vaginal pain.  Musculoskeletal:  Positive for back pain. Negative for myalgias and neck pain.  Skin: Negative.   Psychiatric/Behavioral:  Negative for decreased concentration, hallucinations, sleep disturbance and suicidal ideas. The patient is not nervous/anxious and is not hyperactive.    Objective There were no vitals taken for this visit.  Physical Exam Vitals and nursing note reviewed.   Constitutional:      Appearance: Normal appearance.  HENT:     Head: Normocephalic and atraumatic.     Nose: Nose normal. No congestion.  Eyes:     Extraocular Movements: Extraocular movements intact.     Conjunctiva/sclera: Conjunctivae normal.     Pupils: Pupils are equal, round, and reactive to light.   Cardiovascular:     Rate and Rhythm: Normal rate and regular rhythm.     Pulses: Normal pulses.     Heart sounds: Normal heart sounds.  Pulmonary:     Effort: Pulmonary effort is normal.     Breath sounds: Normal breath sounds.  Abdominal:     General: Abdomen is flat. Bowel sounds are normal.     Palpations: Abdomen is soft.     Tenderness: There is abdominal tenderness. There is left CVA tenderness and guarding. There is no rebound.  Musculoskeletal:        General: Normal range of motion.     Lumbar back: No swelling, tenderness or bony tenderness. Normal range of motion. Negative right straight leg raise test and negative left straight leg raise test.  Skin:    General: Skin is warm and  dry.     Capillary Refill: Capillary refill takes less than 2 seconds.  Neurological:     General: No focal deficit present.     Mental Status: Chiquita is alert and oriented to person, place, and time.  Psychiatric:        Mood and Affect: Mood normal.        Behavior: Behavior normal.    Assessment and Plan 1. Flank pain, acute (Primary) 2. Urinary hesitancy - Differentials : Discuss possible  acute pyelonephritis, ureteral stone vs. complicated UTI- UA positive for leukocytes and trace of blood; flank pain and nausea present. - Will Start empiric antibiotics for presumed pyelonephritis/complicated UTI:  Ceftriaxone 1g plus - Ciprofloxacin pending culture. - Discuss with symptoms the possible need for CT abdomen/pelvis  if symptoms do not improve 48 hrs. - Encourage increased oral fluids. - Recommend OTC tylenol  acetaminophen  as needed for pain - URINE DIP (POCT) MANUAL Urine Routine; Future - URINE CULTURE, COMPREHENSIVE Urine Urine Routine; Future - cefTRIAXone (Rocephin) vial 1 g - ciprofloxacin (CIPRO) 500 mg tablet; Take 1 Tablet by mouth 2 (two) times daily for 7 days Indications: bacterial urinary tract infection.  Dispense: 14 Tablet; Refill: 0

## 2024-02-08 ENCOUNTER — Encounter: Payer: Self-pay | Admitting: Gastroenterology

## 2024-02-08 ENCOUNTER — Ambulatory Visit: Admitting: Gastroenterology

## 2024-02-08 VITALS — BP 130/70 | HR 108 | Ht 64.0 in | Wt 197.0 lb

## 2024-02-08 DIAGNOSIS — R140 Abdominal distension (gaseous): Secondary | ICD-10-CM | POA: Diagnosis not present

## 2024-02-08 DIAGNOSIS — R12 Heartburn: Secondary | ICD-10-CM

## 2024-02-08 DIAGNOSIS — K5909 Other constipation: Secondary | ICD-10-CM

## 2024-02-08 DIAGNOSIS — R143 Flatulence: Secondary | ICD-10-CM | POA: Diagnosis not present

## 2024-02-08 DIAGNOSIS — Z1211 Encounter for screening for malignant neoplasm of colon: Secondary | ICD-10-CM

## 2024-02-08 MED ORDER — NA SULFATE-K SULFATE-MG SULF 17.5-3.13-1.6 GM/177ML PO SOLN: 1.0000 | Freq: Once | ORAL | 0 refills | Status: AC

## 2024-02-08 NOTE — Progress Notes (Signed)
 Chief Complaint:Special screening for malignant neoplasms, colon  Primary GI Doctor:Dr. Charlanne  HPI:  Patient is a  48  year old female patient with past medical history of GERD, ADHD, who presents for a evaluation of special screening for malignant neoplasms, colon  .    Interval History    Patient presents for evaluation for colon screening colonoscopy. Patient has intermittent issues with constipation if she eats certain foods and doesn't consume adequate amount of fiber. She has been trying to incorporate more fiber and uses OTC Miralax prn which helps. She has intermittent blood in stool and has had issues with hemorrhoids she manages with OTC suppositories.   She has occasional GERD controlled with dietary modifications.    Never had EGD/colon.  Nonsmoker. Socially drinks.   No blood thinners.   Surgical history: appendectomy, bunionectomy, tubal ligation.  Patient's family history includes uncle with lung CA and esophageal CA.  Wt Readings from Last 3 Encounters:  02/08/24 197 lb (89.4 kg)  10/05/23 176 lb (79.8 kg)  05/11/23 182 lb (82.6 kg)     Past Medical History:  Diagnosis Date   Allergy    Anxiety    Depression    GERD (gastroesophageal reflux disease)    History of chicken pox     Past Surgical History:  Procedure Laterality Date   APPENDECTOMY     BUNIONECTOMY  1988   TUBAL LIGATION      Current Outpatient Medications  Medication Sig Dispense Refill   amphetamine -dextroamphetamine  (ADDERALL) 10 MG tablet Take 1 tablet (10 mg total) by mouth daily with breakfast. 30 tablet 0   cetirizine (ZYRTEC) 10 MG tablet Take 10 mg by mouth daily.     Cholecalciferol (VITAMIN D) 50 MCG (2000 UT) CAPS Take 1 capsule by mouth daily.     levonorgestrel  (MIRENA , 52 MG,) 20 MCG/DAY IUD Take by intrauterine route.     Multiple Vitamins-Minerals (HAIR SKIN & NAILS PO) Take 1 tablet by mouth daily.     No current facility-administered medications for this visit.     Allergies as of 02/08/2024 - Review Complete 02/08/2024  Allergen Reaction Noted   Nsaids Anaphylaxis, Hives, Itching, Swelling, and Palpitations 02/08/2024    Family History  Problem Relation Age of Onset   Esophageal cancer Maternal Uncle    Colon cancer Neg Hx    Colon polyps Neg Hx    Rectal cancer Neg Hx    Stomach cancer Neg Hx     Review of Systems:    Constitutional: No weight loss, fever, chills, weakness or fatigue HEENT: Eyes: No change in vision               Ears, Nose, Throat:  No change in hearing or congestion Skin: No rash or itching Cardiovascular: No chest pain, chest pressure or palpitations   Respiratory: No SOB or cough Gastrointestinal: See HPI and otherwise negative Genitourinary: No dysuria or change in urinary frequency Neurological: No headache, dizziness or syncope Musculoskeletal: No new muscle or joint pain Hematologic: No bleeding or bruising Psychiatric: No history of depression or anxiety    Physical Exam:  Vital signs: BP 130/70 (BP Location: Left Arm, Patient Position: Sitting, Cuff Size: Normal)   Pulse (!) 108   Ht 5' 4 (1.626 m) Comment: height measured without shoes  Wt 197 lb (89.4 kg)   BMI 33.81 kg/m   Constitutional:   Pleasant  female appears to be in NAD, Well developed, Well nourished, alert and cooperative Throat: Oral  cavity and pharynx without inflammation, swelling or lesion.  Respiratory: Respirations even and unlabored. Lungs clear to auscultation bilaterally.   No wheezes, crackles, or rhonchi.  Cardiovascular: Normal S1, S2. Regular rate and rhythm. No peripheral edema, cyanosis or pallor.  Gastrointestinal:  Soft, nondistended, nontender. No rebound or guarding. Normal bowel sounds. No appreciable masses or hepatomegaly. Rectal:  Not performed. Declined Msk:  Symmetrical without gross deformities. Without edema, no deformity or joint abnormality.  Neurologic:  Alert and  oriented x4;  grossly normal  neurologically.  Skin:   Dry and intact without significant lesions or rashes.  RELEVANT LABS AND IMAGING: CBC    Latest Ref Rng & Units 01/05/2020    1:22 PM 03/12/2019    9:29 AM 07/22/2018    4:47 PM  CBC  WBC 3.8 - 10.8 Thousand/uL 8.5  7.1  10.6   Hemoglobin 11.7 - 15.5 g/dL 86.9  86.9  85.9   Hematocrit 35.0 - 45.0 % 40.4  39.1  43.3   Platelets 140 - 400 Thousand/uL 351  324.0  349.0      CMP     Latest Ref Rng & Units 01/05/2020    1:22 PM 03/12/2019    9:29 AM 07/22/2018    4:47 PM  CMP  Glucose 65 - 99 mg/dL 92  82  84   BUN 7 - 25 mg/dL 17  16  19    Creatinine 0.50 - 1.10 mg/dL 9.12  9.12  9.03   Sodium 135 - 146 mmol/L 136  138  138   Potassium 3.5 - 5.3 mmol/L 4.3  4.4  4.2   Chloride 98 - 110 mmol/L 102  104  103   CO2 20 - 32 mmol/L 25  27  25    Calcium 8.6 - 10.2 mg/dL 9.1  8.9  9.6   Total Protein 6.1 - 8.1 g/dL 7.1     Total Bilirubin 0.2 - 1.2 mg/dL 0.2     AST 10 - 30 U/L 18     ALT 6 - 29 U/L 10        Lab Results  Component Value Date   TSH 0.76 06/03/2020   10/24 labs show: BUN 13, creatinine 0.99,amylase 108, lipase 30, h pylori breath test negative  Assessment: Encounter Diagnoses  Name Primary?   Special screening for malignant neoplasms, colon Yes   Chronic constipation    Flatulence    Bloating    Heartburn      48 year old female patient who presents for colon screening colonoscopy, will schedule in [facility] with Dr. Charlanne. Instructed patient to take daily Miralax week before procedure and as needed for chronic constipation.    Patient has occasional GERD and reinforced GERD diet and can use Pepcid  prn.    For the gas and bloat she can use OTC simethicone or Gas-x. Provide pamphlet on low fodmap diet to review dietary modifications as needed.   Plan: - Recommend GERD diet, no late meals, 3-4 hours before lying down -OTC Pepcid  prn  -Review low fodmap diet for gas/bloat -OTC Miralax po daily  -Schedule for a colonoscopy in [facility] with Dr.  Charlanne. The risks and benefits of colonoscopy with possible polypectomy / biopsies were discussed and the patient agrees to proceed.    Thank you for the courtesy of this consult. Please call me with any questions or concerns.   Tenoch Mcclure, FNP-C [facility] 02/08/2024, 9:35 AM  Cc: Avva, Ravisankar, MD

## 2024-02-08 NOTE — Patient Instructions (Addendum)
 Gas and bloating  Recommend low fodmap diet OTC simethicone or Gas-X ok  GERD Recommend GERD diet, no late meals 3-4 hours before lying down OTC Options: Reflux gourmet Pepcid   Constipation Recommend 30-32 grams of fiber /day Drink plenty of water OTC Miralax po daily as needed  Hemorrhoids Avoid pushing or straining with bowel movements OTC Prep H   We have sent the following medications to your pharmacy for you to pick up at your convenience: SUPREP  You have been scheduled for a colonoscopy. Please follow written instructions given to you at your visit today.   If you use inhalers (even only as needed), please bring them with you on the day of your procedure.  DO NOT TAKE 7 DAYS PRIOR TO TEST- Trulicity (dulaglutide) Ozempic, Wegovy (semaglutide) Mounjaro (tirzepatide) Bydureon Bcise (exanatide extended release)  DO NOT TAKE 1 DAY PRIOR TO YOUR TEST Rybelsus (semaglutide) Adlyxin (lixisenatide) Victoza (liraglutide) Byetta (exanatide) ___________________________________________________________________________  Due to recent changes in healthcare laws, you may see the results of your imaging and laboratory studies on MyChart before your provider has had a chance to review them.  We understand that in some cases there may be results that are confusing or concerning to you. Not all laboratory results come back in the same time frame and the provider may be waiting for multiple results in order to interpret others.  Please give us  48 hours in order for your provider to thoroughly review all the results before contacting the office for clarification of your results.   _______________________________________________________  If your blood pressure at your visit was 140/90 or greater, please contact your primary care physician to follow up on this.  _______________________________________________________  If you are age 48 or older, your body mass index should be between  23-30. Your Body mass index is 33.81 kg/m. If this is out of the aforementioned range listed, please consider follow up with your Primary Care Provider.  If you are age 48 or younger, your body mass index should be between 19-25. Your Body mass index is 33.81 kg/m. If this is out of the aformentioned range listed, please consider follow up with your Primary Care Provider.   ________________________________________________________  The [facility] providers would like to encourage you to use MYCHART to communicate with providers for non-urgent requests or questions.  Due to long hold times on the telephone, sending your provider a message by [facility] may be a faster and more efficient way to get a response.  Please allow 48 business hours for a response.  Please remember that this is for non-urgent requests.  _______________________________________________________  [facility] is using a team-based approach to care.  Your team is made up of your doctor and two to three APPS. Our APPS (Nurse Practitioners and Physician Assistants) work with your physician to ensure care continuity for you. They are fully qualified to address your health concerns and develop a treatment plan. They communicate directly with your gastroenterologist to care for you. Seeing the Advanced Practice Practitioners on your physician's team can help you by facilitating care more promptly, often allowing for earlier appointments, access to diagnostic testing, procedures, and other specialty referrals.   Thank you for trusting me with your gastrointestinal care. Deanna May, FNP-C

## 2024-02-19 DIAGNOSIS — N6012 Diffuse cystic mastopathy of left breast: Secondary | ICD-10-CM | POA: Diagnosis not present

## 2024-02-19 DIAGNOSIS — N6002 Solitary cyst of left breast: Secondary | ICD-10-CM | POA: Diagnosis not present

## 2024-02-19 DIAGNOSIS — R92322 Mammographic fibroglandular density, left breast: Secondary | ICD-10-CM | POA: Diagnosis not present

## 2024-03-24 DIAGNOSIS — Z0189 Encounter for other specified special examinations: Secondary | ICD-10-CM | POA: Diagnosis not present

## 2024-03-24 DIAGNOSIS — Z1212 Encounter for screening for malignant neoplasm of rectum: Secondary | ICD-10-CM | POA: Diagnosis not present

## 2024-03-26 DIAGNOSIS — Z79899 Other long term (current) drug therapy: Secondary | ICD-10-CM | POA: Diagnosis not present

## 2024-03-26 DIAGNOSIS — R82998 Other abnormal findings in urine: Secondary | ICD-10-CM | POA: Diagnosis not present

## 2024-04-04 ENCOUNTER — Encounter: Payer: Self-pay | Admitting: Gastroenterology

## 2024-04-11 ENCOUNTER — Ambulatory Visit: Admitting: Gastroenterology

## 2024-04-11 ENCOUNTER — Encounter: Payer: Self-pay | Admitting: Gastroenterology

## 2024-04-11 VITALS — BP 104/52 | HR 75 | Temp 98.2°F | Resp 15 | Ht 64.0 in | Wt 197.0 lb

## 2024-04-11 DIAGNOSIS — Z1211 Encounter for screening for malignant neoplasm of colon: Secondary | ICD-10-CM

## 2024-04-11 DIAGNOSIS — K640 First degree hemorrhoids: Secondary | ICD-10-CM

## 2024-04-11 DIAGNOSIS — D120 Benign neoplasm of cecum: Secondary | ICD-10-CM

## 2024-04-11 DIAGNOSIS — K5730 Diverticulosis of large intestine without perforation or abscess without bleeding: Secondary | ICD-10-CM | POA: Diagnosis not present

## 2024-04-11 DIAGNOSIS — D122 Benign neoplasm of ascending colon: Secondary | ICD-10-CM | POA: Diagnosis not present

## 2024-04-11 MED ORDER — SODIUM CHLORIDE 0.9 % IV SOLN: 500.0000 mL | Freq: Once | INTRAVENOUS | Status: DC

## 2024-04-11 NOTE — Progress Notes (Signed)
 Called to room to assist during endoscopic procedure.  Patient ID and intended procedure confirmed with present staff. Received instructions for my participation in the procedure from the performing physician.

## 2024-04-11 NOTE — Patient Instructions (Addendum)
 High fiber diet.                           - Continue present medications.                           - Await pathology results.                           - Repeat colonoscopy for surveillance based on                            pathology results.                           - The findings and recommendations were discussed                            with the patient's family. Handout on polyps and diverticulosis.    YOU HAD AN ENDOSCOPIC PROCEDURE TODAY AT THE [facility]:   Refer to the procedure report that was given to you for any specific questions about what was found during the examination.  If the procedure report does not answer your questions, please call your gastroenterologist to clarify.  If you requested that your care partner not be given the details of your procedure findings, then the procedure report has been included in a sealed envelope for you to review at your convenience later.  YOU SHOULD EXPECT: Some feelings of bloating in the abdomen. Passage of more gas than usual.  Walking can help get rid of the air that was put into your GI tract during the procedure and reduce the bloating. If you had a lower endoscopy (such as a colonoscopy or flexible sigmoidoscopy) you may notice spotting of blood in your stool or on the toilet paper. If you underwent a bowel prep for your procedure, you may not have a normal bowel movement for a few days.  Please Note:  You might notice some irritation and congestion in your nose or some drainage.  This is from the oxygen used during your procedure.  There is no need for concern and it should clear up in a day or so.  SYMPTOMS TO REPORT IMMEDIATELY:  Following lower endoscopy (colonoscopy or flexible sigmoidoscopy):  Excessive amounts of blood in the stool  Significant tenderness or worsening of abdominal pains  Swelling of the abdomen that is new, acute  Fever of 100F or higher   For urgent or emergent issues, a  gastroenterologist can be reached at any hour by calling [phone]. Do not use MyChart messaging for urgent concerns.    DIET:  We do recommend a small meal at first, but then you may proceed to your regular diet.  Drink plenty of fluids but you should avoid alcoholic beverages for 24 hours.  ACTIVITY:  You should plan to take it easy for the rest of today and you should NOT DRIVE or use heavy machinery until tomorrow (because of the sedation medicines used during the test).    FOLLOW UP: Our staff will call the number listed on your records the next business day following your procedure.  We will call around 7:15- 8:00 am to check on you and address any questions or concerns that you  may have regarding the information given to you following your procedure. If we do not reach you, we will leave a message.     If any biopsies were taken you will be contacted by phone or by letter within the next 1-3 weeks.  Please call us  at [phone] if you have not heard about the biopsies in 3 weeks.    SIGNATURES/CONFIDENTIALITY: You and/or your care partner have signed paperwork which will be entered into your electronic medical record.  These signatures attest to the fact that that the information above on your After Visit Summary has been reviewed and is understood.  Full responsibility of the confidentiality of this discharge information lies with you and/or your care-partner.

## 2024-04-11 NOTE — Progress Notes (Signed)
 A/o x 3, VSS, good SR's, pleased with anesthesia, report to RN

## 2024-04-11 NOTE — Progress Notes (Signed)
 [facility] History and Physical   Primary Care Physician:  Janey Santos, MD   Reason for Procedure:    CRC screening  Plan:     colonoscopy     HPI: Paige Wilkinson is a 48 y.o. female    Past Medical History:  Diagnosis Date   Allergy    Anxiety    Depression    GERD (gastroesophageal reflux disease)    History of chicken pox     Past Surgical History:  Procedure Laterality Date   APPENDECTOMY     BUNIONECTOMY  1988   TUBAL LIGATION      Prior to Admission medications   Medication Sig Start Date End Date Taking? Authorizing Provider  Cholecalciferol (VITAMIN D) 50 MCG (2000 UT) CAPS Take 1 capsule by mouth daily.   Yes [provider]  levonorgestrel  (MIRENA , 52 MG,) 20 MCG/DAY IUD Take by intrauterine route. 09/13/20  Yes [provider]  Multiple Vitamins-Minerals (HAIR SKIN & NAILS PO) Take 1 tablet by mouth daily.   Yes [provider]  amphetamine -dextroamphetamine  (ADDERALL) 10 MG tablet Take 1 tablet (10 mg total) by mouth daily with breakfast. 09/02/20   Mercer Clotilda SAUNDERS, MD  cetirizine (ZYRTEC) 10 MG tablet Take 10 mg by mouth daily.    [provider]    Current Outpatient Medications  Medication Sig Dispense Refill   Cholecalciferol (VITAMIN D) 50 MCG (2000 UT) CAPS Take 1 capsule by mouth daily.     levonorgestrel  (MIRENA , 52 MG,) 20 MCG/DAY IUD Take by intrauterine route.     Multiple Vitamins-Minerals (HAIR SKIN & NAILS PO) Take 1 tablet by mouth daily.     amphetamine -dextroamphetamine  (ADDERALL) 10 MG tablet Take 1 tablet (10 mg total) by mouth daily with breakfast. 30 tablet 0   cetirizine (ZYRTEC) 10 MG tablet Take 10 mg by mouth daily.     No current facility-administered medications for this visit.    Allergies as of 04/11/2024 - Review Complete 04/11/2024  Allergen Reaction Noted   Nsaids Anaphylaxis, Hives, Itching, Swelling, and Palpitations 02/08/2024    Family History  Problem  Relation Age of Onset   Esophageal cancer Maternal Uncle    Colon cancer Neg Hx    Colon polyps Neg Hx    Rectal cancer Neg Hx    Stomach cancer Neg Hx     Social History   Socioeconomic History   Marital status: Married    Spouse name: Not on file   Number of children: Not on file   Years of education: Not on file   Highest education level: Not on file  Occupational History   Not on file  Tobacco Use   Smoking status: Former   Smokeless tobacco: Never  Vaping Use   Vaping status: Never Used  Substance and Sexual Activity   Alcohol  use: Never   Drug use: No   Sexual activity: Not on file  Other Topics Concern   Not on file  Social History Narrative   Right Handed   One Story Home   Drinks Caffeine - 2 cups a day   Social Drivers of Health   Financial Resource Strain: Not on File (04/28/2022)   Received from [employer]    Financial Resource Strain: 0  Food Insecurity: Not on File (03/22/2023)   Received from [facility] Insecurity    Food: 0  Transportation Needs: Not on File (04/28/2022)   Received from [employer]  Transportation: 0  Physical Activity: Not on File (04/28/2022)   Received from [facility]   Physical Activity    Physical Activity: 0  Stress: Not on File (04/28/2022)   Received from [facility]   Stress    Stress: 0  Social Connections: Not on File (03/11/2023)   Received from [employer]   Social Connections    Connectedness: 0  Intimate Partner Violence: Not on file    Review of Systems: Positive for none All other review of systems negative except as mentioned in the HPI.  Physical Exam: Vital signs in last 24 hours: @VSRANGES @   General:   Alert,  Well-developed, well-nourished, pleasant and cooperative in NAD Lungs:  Clear throughout to auscultation.   Heart:  Regular rate and rhythm; no murmurs, clicks, rubs,  or gallops. Abdomen:  Soft, nontender and nondistended. Normal bowel sounds.   Neuro/Psych:   Alert and cooperative. Normal mood and affect. A and O x 3    No significant changes were identified.  The patient continues to be an appropriate candidate for the planned procedure and anesthesia.   Anselm Bring, MD. [facility] 04/11/2024 3:54 PM@

## 2024-04-11 NOTE — Op Note (Signed)
 [facility] Patient Name: Paige Wilkinson Procedure Date: 04/11/2024 1:36 PM MRN: [id number] Endoscopist: Lynnie Bring , MD, [id number] Age: 48 Referring MD:  Date of Birth: 13-Nov-1975 Gender: Female Account #: [id number] Procedure:                Colonoscopy Indications:              Screening for colorectal malignant neoplasm Medicines:                Monitored Anesthesia Care Procedure:                Pre-Anesthesia Assessment:                           - Prior to the procedure, a History and Physical                            was performed, and patient medications and                            allergies were reviewed. The patient's tolerance of                            previous anesthesia was also reviewed. The risks                            and benefits of the procedure and the sedation                            options and risks were discussed with the patient.                            All questions were answered, and informed consent                            was obtained. Prior Anticoagulants: The patient has                            taken no anticoagulant or antiplatelet agents. ASA                            Grade Assessment: II - A patient with mild systemic                            disease. After reviewing the risks and benefits,                            the patient was deemed in satisfactory condition to                            undergo the procedure.                           After obtaining informed consent, the colonoscope  was passed under direct vision. Throughout the                            procedure, the patient's blood pressure, pulse, and                            oxygen saturations were monitored continuously. The                            Olympus Scope CF SN: [id number] was introduced through                            the anus and advanced to the 2 cm into the ileum.                            The  colonoscopy was performed without difficulty.                            The patient tolerated the procedure well. The                            quality of the bowel preparation was good. The                            terminal ileum, ileocecal valve, appendiceal                            orifice, and rectum were photographed. Scope In: 1:40:11 PM Scope Out: 1:57:40 PM Scope Withdrawal Time: 0 hours 13 minutes 42 seconds  Total Procedure Duration: 0 hours 17 minutes 29 seconds  Findings:                 Two sessile polyps were found in the distal                            ascending colon and cecum. The polyps were 4 to 6                            mm in size. These polyps were removed with a cold                            snare. Resection and retrieval were complete.                           Rare small-mouthed diverticula were found in the                            sigmoid colon.                           Internal hemorrhoids were found during                            retroflexion. The hemorrhoids were small and  Grade                            I (internal hemorrhoids that do not prolapse).                           The terminal ileum appeared normal.                           Retroflexion in the right colon was performed.                           The exam was otherwise without abnormality on                            direct and retroflexion views. Complications:            No immediate complications. Estimated Blood Loss:     Estimated blood loss: none. Impression:               - Two 4 to 6 mm polyps in the distal ascending                            colon and in the cecum, removed with a cold snare.                            Resected and retrieved.                           - Minimal sigmoid diverticulosis.                           - Internal hemorrhoids.                           - The examined portion of the ileum was normal.                           - The examination  was otherwise normal on direct                            and retroflexion views. Recommendation:           - Patient has a contact number available for                            emergencies. The signs and symptoms of potential                            delayed complications were discussed with the                            patient. Return to normal activities tomorrow.                            Written discharge instructions were provided to the  patient.                           - High fiber diet.                           - Continue present medications.                           - Await pathology results.                           - Repeat colonoscopy for surveillance based on                            pathology results.                           - The findings and recommendations were discussed                            with the patient's family. Lynnie Bring, MD 04/11/2024 2:01:44 PM This report has been signed electronically.

## 2024-04-11 NOTE — Progress Notes (Signed)
 Pt's states no medical or surgical changes since previsit or office visit.

## 2024-04-14 ENCOUNTER — Telehealth: Payer: Self-pay

## 2024-04-14 NOTE — Telephone Encounter (Signed)
 Attempted to reach patient for follow up phone call. No answer, left voicemail to contact Dr. Hobert Lull office with any questions or concerns.

## 2024-04-16 LAB — SURGICAL PATHOLOGY

## 2024-04-19 ENCOUNTER — Ambulatory Visit: Payer: Self-pay | Admitting: Gastroenterology

## 2024-04-27 ENCOUNTER — Other Ambulatory Visit: Payer: Self-pay | Admitting: Medical Genetics

## 2024-04-27 DIAGNOSIS — Z006 Encounter for examination for normal comparison and control in clinical research program: Secondary | ICD-10-CM

## 2024-05-12 DIAGNOSIS — Z8349 Family history of other endocrine, nutritional and metabolic diseases: Secondary | ICD-10-CM | POA: Diagnosis not present

## 2024-05-12 DIAGNOSIS — Z8342 Family history of familial hypercholesterolemia: Secondary | ICD-10-CM | POA: Diagnosis not present

## 2024-05-12 DIAGNOSIS — Z6832 Body mass index (BMI) 32.0-32.9, adult: Secondary | ICD-10-CM | POA: Diagnosis not present

## 2024-05-12 DIAGNOSIS — Z713 Dietary counseling and surveillance: Secondary | ICD-10-CM | POA: Diagnosis not present

## 2024-05-21 DIAGNOSIS — Z8349 Family history of other endocrine, nutritional and metabolic diseases: Secondary | ICD-10-CM | POA: Diagnosis not present

## 2024-05-21 DIAGNOSIS — Z6832 Body mass index (BMI) 32.0-32.9, adult: Secondary | ICD-10-CM | POA: Diagnosis not present

## 2024-05-21 DIAGNOSIS — Z713 Dietary counseling and surveillance: Secondary | ICD-10-CM | POA: Diagnosis not present

## 2024-05-21 DIAGNOSIS — Z8342 Family history of familial hypercholesterolemia: Secondary | ICD-10-CM | POA: Diagnosis not present

## 2024-05-28 DIAGNOSIS — Z6832 Body mass index (BMI) 32.0-32.9, adult: Secondary | ICD-10-CM | POA: Diagnosis not present

## 2024-05-28 DIAGNOSIS — Z8349 Family history of other endocrine, nutritional and metabolic diseases: Secondary | ICD-10-CM | POA: Diagnosis not present

## 2024-05-28 DIAGNOSIS — Z713 Dietary counseling and surveillance: Secondary | ICD-10-CM | POA: Diagnosis not present

## 2024-05-28 DIAGNOSIS — Z8342 Family history of familial hypercholesterolemia: Secondary | ICD-10-CM | POA: Diagnosis not present

## 2024-06-03 ENCOUNTER — Ambulatory Visit (HOSPITAL_COMMUNITY): Admission: EM | Admit: 2024-06-03 | Discharge: 2024-06-03 | Disposition: A | Discharge status: Home / Self Care

## 2024-06-03 ENCOUNTER — Encounter (HOSPITAL_COMMUNITY): Payer: Self-pay

## 2024-06-03 ENCOUNTER — Ambulatory Visit (HOSPITAL_COMMUNITY)

## 2024-06-03 ENCOUNTER — Ambulatory Visit (HOSPITAL_COMMUNITY): Payer: Self-pay

## 2024-06-03 DIAGNOSIS — M545 Low back pain, unspecified: Secondary | ICD-10-CM

## 2024-06-03 MED ORDER — METHOCARBAMOL 750 MG PO TABS: 750.0000 mg | ORAL_TABLET | Freq: Four times a day (QID) | ORAL | 0 refills | Status: AC | PRN

## 2024-06-03 MED ORDER — METHYLPREDNISOLONE 4 MG PO TBPK: ORAL_TABLET | ORAL | 0 refills | Status: AC

## 2024-06-03 NOTE — Discharge Instructions (Addendum)
 Diagnosis: You have a strain of the ligaments in your lower back (lumbar spine). This can cause pain, stiffness, and sometimes nerve symptoms like numbness. Your left foot numbness has persisted even though your sciatica (shooting leg pain) has improved. Imaging today did not show any concerning findings.  Treatment Plan:  - Methylprednisolone  Dose Pack: Take as directed (6 tablets on day 1, then decrease by 1 tablet each day for 6 days). This is to reduce inflammation. - Methocarbamol : Take as prescribed for muscle relaxation and to help with spasms. - Heat Therapy: Apply a heating pad or warm compress to your lower back for 15-20 minutes at a time, several times a day, to help relax muscles and relieve pain. - Tylenol  (Acetaminophen ): Take as needed for pain, following dosing instructions on the package or as prescribed.  Activity:  - Continue gentle movement and avoid prolonged bed rest. - Avoid heavy lifting, twisting, or activities that worsen your symptoms. - Gradually return to normal activities as tolerated.  What to Expect:  - Most ligament strains improve with time, rest, and supportive care. - Numbness may take longer to resolve, but should gradually improve as inflammation decreases.  When to Seek Further Care: Contact your provider or seek immediate care if you experience:  - Worsening numbness, weakness, or tingling in your leg or foot - New or worsening back pain - Loss of bladder or bowel control (incontinence) - Difficulty walking or standing - Severe pain not relieved by medication  Follow-Up:  - If symptoms are not improving after completing the medication course, or if you have any concerns, schedule a follow-up appointment.

## 2024-06-03 NOTE — ED Provider Notes (Signed)
 [facility]    CSN: [id number] Arrival date & time: 06/03/24  1308      History   Chief Complaint No chief complaint on file.   HPI Paige Wilkinson is a 48 y.o. female.   [name] presents today with complaints of low back pain and numbness of the left foot.  She reports that 10 days ago she was in the grocery store and noticed somebody began to fall.  In an attempt to catch them she twisted her back and has had pain since.  She reports that after injury low back pain was 10 out of 10 described as tightness and stretching in the midline lumbar spine.  She also had tightness and sharp pains going down bilateral legs.  She took 20 mg of prednisone  daily for 4 days and pain reduced to 5 out of 10 and pain in bilateral legs abated.  However 4 days ago she noticed numbness in her lateral left foot as well as her calf.  She describes the numbness as tingling, like it is asleep.  She states that it is interfering with her gait, and she is afraid of falling.  She also reports that her left foot has felt cold to her.  It has not been associated with any discoloration.  She denies saddle paresthesia, bowel/bladder incontinence and weakness in her lower extremities.  She has been using Tylenol  and Bengay for her symptoms, but feels like they are not getting better  The history is provided by the patient.    Past Medical History:  Diagnosis Date   Allergy    Anxiety    Depression    GERD (gastroesophageal reflux disease)    History of chicken pox     Patient Active Problem List   Diagnosis Date Noted   Depression, major, single episode, moderate (HCC) 07/03/2020   Anxiety 07/03/2020   ADD (attention deficit disorder) 10/18/2013   Personal history of urinary disorder 06/15/2010   Personal history presenting hazards to health 06/15/2010    Past Surgical History:  Procedure Laterality Date   APPENDECTOMY     BUNIONECTOMY  1988   TUBAL LIGATION      OB History   No  obstetric history on file.      Home Medications    Prior to Admission medications   Medication Sig Start Date End Date Taking? Authorizing Provider  methocarbamol  (ROBAXIN ) 750 MG tablet Take 1 tablet (750 mg total) by mouth 4 (four) times daily as needed for muscle spasms. 06/03/24  Yes Leatrice Vernell HERO, NP  methylPREDNISolone  (MEDROL  DOSEPAK) 4 MG TBPK tablet Day 1: take 6 tabs in the morning; Day 2: take 5 tabs in the morning, decreasing by one pill each day until complete 06/03/24  Yes Leatrice Vernell HERO, NP  amphetamine -dextroamphetamine  (ADDERALL) 10 MG tablet Take 1 tablet (10 mg total) by mouth daily with breakfast. 09/02/20   Mercer Clotilda SAUNDERS, MD  cetirizine (ZYRTEC) 10 MG tablet Take 10 mg by mouth daily.    [provider]  Cholecalciferol (VITAMIN D) 50 MCG (2000 UT) CAPS Take 1 capsule by mouth daily.    [provider]  levonorgestrel  (MIRENA , 52 MG,) 20 MCG/DAY IUD Take by intrauterine route. 09/13/20   [provider]  Multiple Vitamins-Minerals (HAIR SKIN & NAILS PO) Take 1 tablet by mouth daily.    [provider]    Family History Family History  Problem Relation Age of Onset   Esophageal cancer Maternal Uncle  Colon cancer Neg Hx    Colon polyps Neg Hx    Rectal cancer Neg Hx    Stomach cancer Neg Hx     Social History Social History   Tobacco Use   Smoking status: Never   Smokeless tobacco: Never  Vaping Use   Vaping status: Never Used  Substance Use Topics   Alcohol  use: Never   Drug use: No     Allergies   Nsaids   Review of Systems Review of Systems  Constitutional: Negative.   Musculoskeletal:  Positive for back pain and myalgias. Negative for gait problem, neck pain and neck stiffness.  Skin: Negative.   Neurological:  Positive for numbness. Negative for tremors, weakness, light-headedness and headaches.     Physical Exam Triage Vital Signs ED Triage Vitals  Encounter Vitals Group     BP  06/03/24 1511 130/81     Girls Systolic BP Percentile --      Girls Diastolic BP Percentile --      Boys Systolic BP Percentile --      Boys Diastolic BP Percentile --      Pulse Rate 06/03/24 1511 82     Resp 06/03/24 1511 16     Temp 06/03/24 1511 98 F (36.7 C)     Temp Source 06/03/24 1511 Oral     SpO2 06/03/24 1511 98 %     Weight --      Height --      Head Circumference --      Peak Flow --      Pain Score 06/03/24 1513 8     Pain Loc --      Pain Education --      Exclude from Growth Chart --    No data found.  Updated Vital Signs BP 130/81 (BP Location: Right Arm)   Pulse 82   Temp 98 F (36.7 C) (Oral)   Resp 16   SpO2 98%   Visual Acuity Right Eye Distance:   Left Eye Distance:   Bilateral Distance:    Right Eye Near:   Left Eye Near:    Bilateral Near:     Physical Exam Vitals and nursing note reviewed.  Constitutional:      General: She is not in acute distress.    Appearance: Normal appearance. She is normal weight. She is not toxic-appearing.  Eyes:     Conjunctiva/sclera: Conjunctivae normal.  Cardiovascular:     Rate and Rhythm: Normal rate and regular rhythm.     Pulses:          Dorsalis pedis pulses are 2+ on the right side and 2+ on the left side.     Heart sounds: Normal heart sounds.  Pulmonary:     Effort: Pulmonary effort is normal.     Breath sounds: Normal breath sounds and air entry.  Musculoskeletal:     Cervical back: Normal.     Thoracic back: Normal.     Lumbar back: Spasms, tenderness and bony tenderness (L4-5) present. Normal range of motion. Negative right straight leg raise test and negative left straight leg raise test.     Comments: Tenderness over bilateral SI notch  Feet:     Left foot:     Skin integrity: Skin integrity normal.  Skin:    General: Skin is warm and dry.  Neurological:     Mental Status: She is alert and oriented to person, place, and time.     Sensory: Sensation is  intact.     Motor: No  weakness, tremor or abnormal muscle tone.     Comments: Sensation of left lower extremity tested with sharp/dull test.  Patient correctly identified sharp/dull stimulus 7 out of 7 times  Psychiatric:        Mood and Affect: Mood normal.        Behavior: Behavior normal.      UC Treatments / Results  Labs (all labs ordered are listed, but only abnormal results are displayed) Labs Reviewed - No data to display  EKG   Radiology DG Lumbar Spine Complete Result Date: 06/03/2024 CLINICAL DATA:  Midline lumbar spine pain following a fall on 05/25/2024. Left foot numbness. EXAM: LUMBAR SPINE - COMPLETE 4+ VIEW COMPARISON:  None Available. FINDINGS: Five non-rib-bearing lumbar vertebrae. Mild anterior and lateral spur formation at multiple levels of the lumbar and lower thoracic spine. Facet degenerative changes throughout the lumbar spine. Bilateral L5 pars defects without subluxation. No acute fractures or subluxations seen. IMPRESSION: 1. No acute fracture or subluxation. 2. Bilateral L5 spondylolysis without spondylolisthesis. 3. Multilevel degenerative changes. Electronically Signed   By: Elspeth Bathe M.D.   On: 06/03/2024 17:06    Procedures Procedures (including critical care time)  Medications Ordered in UC Medications - No data to display  Initial Impression / Assessment and Plan / UC Course  I have reviewed the triage vital signs and the nursing notes.  Pertinent labs & imaging results that were available during my care of the patient were reviewed by me and considered in my medical decision making (see chart for details).     Lumbar spine 4 view was ordered due to focal tenderness at L4-L5.  This demonstrated bilateral spondylolysis as well as degenerative changes.  This indicates a chronic underlying spinal pathology.  However, it could contribute to the numbness in her foot due to nerve root irritation.  Lower extremities with equal strength and sensation.  Patient is  neurovascularly intact.  Treatment for lumbar strain with radiculopathy-methylprednisolone  pack, methocarbamol  750 mg 4 times a day as needed for muscle spasms, and continue Tylenol  and Bengay to the area. Final Clinical Impressions(s) / UC Diagnoses   Final diagnoses:  Acute midline low back pain without sciatica     Discharge Instructions      Diagnosis: You have a strain of the ligaments in your lower back (lumbar spine). This can cause pain, stiffness, and sometimes nerve symptoms like numbness. Your left foot numbness has persisted even though your sciatica (shooting leg pain) has improved. Imaging today did not show any concerning findings.  Treatment Plan:  - Methylprednisolone  Dose Pack: Take as directed (6 tablets on day 1, then decrease by 1 tablet each day for 6 days). This is to reduce inflammation. - Methocarbamol : Take as prescribed for muscle relaxation and to help with spasms. - Heat Therapy: Apply a heating pad or warm compress to your lower back for 15-20 minutes at a time, several times a day, to help relax muscles and relieve pain. - Tylenol  (Acetaminophen ): Take as needed for pain, following dosing instructions on the package or as prescribed.  Activity:  - Continue gentle movement and avoid prolonged bed rest. - Avoid heavy lifting, twisting, or activities that worsen your symptoms. - Gradually return to normal activities as tolerated.  What to Expect:  - Most ligament strains improve with time, rest, and supportive care. - Numbness may take longer to resolve, but should gradually improve as inflammation decreases.  When to Seek Further  Care: Contact your provider or seek immediate care if you experience:  - Worsening numbness, weakness, or tingling in your leg or foot - New or worsening back pain - Loss of bladder or bowel control (incontinence) - Difficulty walking or standing - Severe pain not relieved by medication  Follow-Up:  - If symptoms are  not improving after completing the medication course, or if you have any concerns, schedule a follow-up appointment.       ED Prescriptions     Medication Sig Dispense Auth. Provider   methylPREDNISolone  (MEDROL  DOSEPAK) 4 MG TBPK tablet Day 1: take 6 tabs in the morning; Day 2: take 5 tabs in the morning, decreasing by one pill each day until complete 21 tablet Leatrice Vernell HERO, NP   methocarbamol  (ROBAXIN ) 750 MG tablet Take 1 tablet (750 mg total) by mouth 4 (four) times daily as needed for muscle spasms. 28 tablet Leatrice Vernell HERO, NP      PDMP not reviewed this encounter.   Leatrice Vernell HERO, NP 06/03/24 1714

## 2024-06-03 NOTE — ED Triage Notes (Addendum)
 Patient reports that she was breaking a patient's fall on 11/30 and began having mid lower back pain. Patient states she began having left foot numbness, foot feeling cold x x 4 days and worsening. Patient states the numbness is the the left lateral side and the left sole of her foot. Patient also has numbness to the left calf area as well.  Patient added that when she walks she feels like she is losing her balance and about to fall due to the numbness.  Patient has been using Ben GAy and Tylenol  for her symptoms.

## 2024-06-04 ENCOUNTER — Ambulatory Visit (HOSPITAL_COMMUNITY): Payer: Self-pay

## 2024-06-04 DIAGNOSIS — Z8349 Family history of other endocrine, nutritional and metabolic diseases: Secondary | ICD-10-CM | POA: Diagnosis not present

## 2024-06-04 DIAGNOSIS — Z8342 Family history of familial hypercholesterolemia: Secondary | ICD-10-CM | POA: Diagnosis not present

## 2024-06-04 DIAGNOSIS — Z713 Dietary counseling and surveillance: Secondary | ICD-10-CM | POA: Diagnosis not present

## 2024-06-04 DIAGNOSIS — Z6832 Body mass index (BMI) 32.0-32.9, adult: Secondary | ICD-10-CM | POA: Diagnosis not present

## 2024-06-10 LAB — GENECONNECT MOLECULAR SCREEN: Genetic Analysis Overall Interpretation: NEGATIVE

## 2024-06-11 DIAGNOSIS — Z713 Dietary counseling and surveillance: Secondary | ICD-10-CM | POA: Diagnosis not present

## 2024-06-11 DIAGNOSIS — Z8349 Family history of other endocrine, nutritional and metabolic diseases: Secondary | ICD-10-CM | POA: Diagnosis not present

## 2024-06-11 DIAGNOSIS — Z8342 Family history of familial hypercholesterolemia: Secondary | ICD-10-CM | POA: Diagnosis not present

## 2024-06-11 DIAGNOSIS — Z6832 Body mass index (BMI) 32.0-32.9, adult: Secondary | ICD-10-CM | POA: Diagnosis not present
